# Patient Record
Sex: Female | Born: 1970 | Race: Black or African American | Hispanic: No | Marital: Married | Smoking: Never smoker
Health system: Southern US, Community
[De-identification: ages and names within clinical notes are randomized; demographics above are authoritative.]

## PROBLEM LIST (undated history)

## (undated) DIAGNOSIS — U071 COVID-19: Secondary | ICD-10-CM

## (undated) DIAGNOSIS — R7303 Prediabetes: Secondary | ICD-10-CM

## (undated) DIAGNOSIS — K8020 Calculus of gallbladder without cholecystitis without obstruction: Secondary | ICD-10-CM

## (undated) DIAGNOSIS — Z9889 Other specified postprocedural states: Secondary | ICD-10-CM

## (undated) DIAGNOSIS — I10 Essential (primary) hypertension: Secondary | ICD-10-CM

## (undated) HISTORY — DX: Essential (primary) hypertension: I10

---

## 1997-08-16 HISTORY — PX: TUBAL LIGATION: SHX77

## 2012-04-19 ENCOUNTER — Other Ambulatory Visit (HOSPITAL_COMMUNITY): Payer: Self-pay | Admitting: Nurse Practitioner

## 2012-04-19 DIAGNOSIS — Z139 Encounter for screening, unspecified: Secondary | ICD-10-CM

## 2012-04-21 ENCOUNTER — Ambulatory Visit (HOSPITAL_COMMUNITY)
Admission: RE | Admit: 2012-04-21 | Discharge: 2012-04-21 | Disposition: A | Discharge status: Home / Self Care | Payer: Self-pay | Source: Ambulatory Visit | Attending: Nurse Practitioner | Admitting: Nurse Practitioner

## 2012-04-21 DIAGNOSIS — Z139 Encounter for screening, unspecified: Secondary | ICD-10-CM

## 2012-04-25 ENCOUNTER — Ambulatory Visit (HOSPITAL_COMMUNITY): Payer: Self-pay

## 2013-03-15 ENCOUNTER — Encounter (HOSPITAL_COMMUNITY): Payer: Self-pay | Admitting: Emergency Medicine

## 2013-03-15 ENCOUNTER — Emergency Department (HOSPITAL_COMMUNITY): Payer: Medicaid Other

## 2013-03-15 ENCOUNTER — Emergency Department (HOSPITAL_COMMUNITY)
Admission: EM | Admit: 2013-03-15 | Discharge: 2013-03-15 | Disposition: A | Discharge status: Home / Self Care | Payer: Medicaid Other | Attending: Emergency Medicine | Admitting: Emergency Medicine

## 2013-03-15 DIAGNOSIS — R1013 Epigastric pain: Secondary | ICD-10-CM | POA: Insufficient documentation

## 2013-03-15 DIAGNOSIS — Z79899 Other long term (current) drug therapy: Secondary | ICD-10-CM | POA: Insufficient documentation

## 2013-03-15 DIAGNOSIS — R109 Unspecified abdominal pain: Secondary | ICD-10-CM

## 2013-03-15 DIAGNOSIS — Z3202 Encounter for pregnancy test, result negative: Secondary | ICD-10-CM | POA: Insufficient documentation

## 2013-03-15 LAB — CBC WITH DIFFERENTIAL/PLATELET
Basophils Absolute: 0 10*3/uL (ref 0.0–0.1)
Basophils Relative: 0 % (ref 0–1)
Eosinophils Absolute: 0.2 10*3/uL (ref 0.0–0.7)
Eosinophils Relative: 2 % (ref 0–5)
HCT: 38 % (ref 36.0–46.0)
Hemoglobin: 13.2 g/dL (ref 12.0–15.0)
Lymphocytes Relative: 40 % (ref 12–46)
Lymphs Abs: 3.6 10*3/uL (ref 0.7–4.0)
MCH: 29.1 pg (ref 26.0–34.0)
MCHC: 34.7 g/dL (ref 30.0–36.0)
MCV: 83.9 fL (ref 78.0–100.0)
Monocytes Absolute: 0.7 10*3/uL (ref 0.1–1.0)
Monocytes Relative: 8 % (ref 3–12)
Neutro Abs: 4.4 10*3/uL (ref 1.7–7.7)
Neutrophils Relative %: 50 % (ref 43–77)
Platelets: 364 10*3/uL (ref 150–400)
RBC: 4.53 MIL/uL (ref 3.87–5.11)
RDW: 12.6 % (ref 11.5–15.5)
WBC: 8.8 10*3/uL (ref 4.0–10.5)

## 2013-03-15 LAB — COMPREHENSIVE METABOLIC PANEL
ALT: 47 U/L — ABNORMAL HIGH (ref 0–35)
AST: 72 U/L — ABNORMAL HIGH (ref 0–37)
Albumin: 3.9 g/dL (ref 3.5–5.2)
Alkaline Phosphatase: 60 U/L (ref 39–117)
BUN: 16 mg/dL (ref 6–23)
CO2: 27 mEq/L (ref 19–32)
Calcium: 10.1 mg/dL (ref 8.4–10.5)
Chloride: 100 mEq/L (ref 96–112)
Creatinine, Ser: 0.75 mg/dL (ref 0.50–1.10)
GFR calc Af Amer: >90 mL/min (ref >90)
GFR calc non Af Amer: >90 mL/min (ref >90)
Glucose, Bld: 119 mg/dL — ABNORMAL HIGH (ref 70–99)
Potassium: 3.5 mEq/L (ref 3.5–5.1)
Sodium: 138 mEq/L (ref 135–145)
Total Bilirubin: 0.3 mg/dL (ref 0.3–1.2)
Total Protein: 7.7 g/dL (ref 6.0–8.3)

## 2013-03-15 LAB — URINALYSIS, ROUTINE W REFLEX MICROSCOPIC
Bilirubin Urine: NEGATIVE
Glucose, UA: NEGATIVE mg/dL
Ketones, ur: NEGATIVE mg/dL
Leukocytes, UA: NEGATIVE
Nitrite: NEGATIVE
Protein, ur: 30 mg/dL — AB
Specific Gravity, Urine: 1.025 (ref 1.005–1.030)
Urobilinogen, UA: 1 mg/dL (ref 0.0–1.0)
pH: 7 (ref 5.0–8.0)

## 2013-03-15 LAB — PREGNANCY, URINE: Preg Test, Ur: NEGATIVE

## 2013-03-15 LAB — URINE MICROSCOPIC-ADD ON

## 2013-03-15 LAB — TROPONIN I: Troponin I: <0.3 ng/mL (ref <0.30)

## 2013-03-15 LAB — LIPASE, BLOOD: Lipase: 34 U/L (ref 11–59)

## 2013-03-15 MED ORDER — OXYCODONE-ACETAMINOPHEN 5-325 MG PO TABS: 1.0000 | ORAL_TABLET | ORAL | Status: DC | PRN

## 2013-03-15 MED ORDER — ONDANSETRON HCL 4 MG/2ML IJ SOLN
4.0000 mg | Freq: Once | INTRAMUSCULAR | Status: AC
  Administered 2013-03-15: 4 mg via INTRAVENOUS
  Filled 2013-03-15: qty 2

## 2013-03-15 MED ORDER — PROMETHAZINE HCL 25 MG PO TABS: 25.0000 mg | ORAL_TABLET | Freq: Four times a day (QID) | ORAL | Status: DC | PRN

## 2013-03-15 MED ORDER — MORPHINE SULFATE 4 MG/ML IJ SOLN
4.0000 mg | Freq: Once | INTRAMUSCULAR | Status: AC
  Administered 2013-03-15: 4 mg via INTRAVENOUS
  Filled 2013-03-15: qty 1

## 2013-03-15 MED ORDER — SODIUM CHLORIDE 0.9 % IV BOLUS (SEPSIS)
1000.0000 mL | Freq: Once | INTRAVENOUS | Status: AC
  Administered 2013-03-15: 1000 mL via INTRAVENOUS

## 2013-03-15 MED ORDER — IOHEXOL 300 MG/ML  SOLN
50.0000 mL | Freq: Once | INTRAMUSCULAR | Status: AC | PRN
  Administered 2013-03-15: 50 mL via INTRAVENOUS

## 2013-03-15 MED ORDER — IOHEXOL 300 MG/ML  SOLN
100.0000 mL | Freq: Once | INTRAMUSCULAR | Status: AC | PRN
  Administered 2013-03-15: 100 mL via INTRAVENOUS

## 2013-03-15 NOTE — ED Provider Notes (Signed)
CSN: [id number]     Arrival date & time 03/15/13  1848 History   First MD Initiated Contact with Patient 03/15/13 1903     Chief Complaint  Patient presents with  . Abdominal Pain  . Shortness of Breath   (Consider location/radiation/quality/duration/timing/severity/associated sxs/prior Treatment) HPI.... abrupt onset sharp epigastric pain approximately 30 minutes prior to visit.   No nausea, vomiting, diarrhea, fever, previous similar symptoms.  Patient is obese but claims to have no other medical problems.  No radiation of pain. Nothing makes symptoms better or worse. Severity is moderate.  History reviewed. No pertinent past medical history. Past Surgical History  Procedure Laterality Date  . Tubal ligation     No family history on file. History  Substance Use Topics  . Smoking status: Never Smoker   . Smokeless tobacco: Not on file  . Alcohol Use: No   OB History   Grav Para Term Preterm Abortions TAB SAB Ect Mult Living                 Review of Systems  All other systems reviewed and are negative.    Allergies  Review of patient's allergies indicates no known allergies.  Home Medications   Current Outpatient Rx  Name  Route  Sig  Dispense  Refill  . omeprazole (PRILOSEC OTC) 20 MG tablet   Oral   Take 20 mg by mouth once as needed.         Marland Kitchen oxyCODONE-acetaminophen (PERCOCET) 5-325 MG per tablet   Oral   Take 1 tablet by mouth every 4 (four) hours as needed for pain.   20 tablet   0   . promethazine (PHENERGAN) 25 MG tablet   Oral   Take 1 tablet (25 mg total) by mouth every 6 (six) hours as needed for nausea.   20 tablet   0    BP 152/111  Pulse 110  Temp(Src) 97.6 F (36.4 C) (Oral)  Resp 22  Ht 5' (1.524 m)  Wt 220 lb (99.791 kg)  BMI 42.97 kg/m2  SpO2 99%  LMP 03/13/2013 Physical Exam  Nursing note and vitals reviewed. Constitutional: She is oriented to person, place, and time. She appears well-developed and well-nourished.  Obese,  moderate distress  HENT:  Head: Normocephalic and atraumatic.  Eyes: Conjunctivae and EOM are normal. Pupils are equal, round, and reactive to light.  Neck: Normal range of motion. Neck supple.  Cardiovascular: Normal rate, regular rhythm and normal heart sounds.   Pulmonary/Chest: Effort normal and breath sounds normal.  Abdominal: Soft. Bowel sounds are normal.  Minimal epigastric tenderness  Musculoskeletal: Normal range of motion.  Neurological: She is alert and oriented to person, place, and time.  Skin: Skin is warm and dry.  Psychiatric: She has a normal mood and affect.    ED Course  Procedures (including critical care time) Labs Review Labs Reviewed  COMPREHENSIVE METABOLIC PANEL - Abnormal; Notable for the following:    Glucose, Bld 119 (*)    AST 72 (*)    ALT 47 (*)    All other components within normal limits  URINALYSIS, ROUTINE W REFLEX MICROSCOPIC - Abnormal; Notable for the following:    Hgb urine dipstick TRACE (*)    Protein, ur 30 (*)    All other components within normal limits  URINE MICROSCOPIC-ADD ON - Abnormal; Notable for the following:    Squamous Epithelial / LPF FEW (*)    All other components within normal limits  CBC WITH  DIFFERENTIAL  LIPASE, BLOOD  PREGNANCY, URINE  TROPONIN I   Imaging Review Ct Abdomen Pelvis W Contrast  03/15/2013   CLINICAL DATA:  Right upper quadrant abdominal pain.  EXAM: CT ABDOMEN AND PELVIS WITH CONTRAST  TECHNIQUE: Multidetector CT imaging of the abdomen and pelvis was performed using the standard protocol following bolus administration of intravenous contrast.  CONTRAST:  OMNIPAQUE IOHEXOL 300 MG/ML  SOLN  COMPARISON:  None.  FINDINGS: Visualized lung bases appear normal. The liver, spleen and pancreas appear normal. No gallstones are noted. Adrenal glands appear normal. No hydronephrosis or renal obstruction is noted. No renal or ureteral calculi are noted. The appendix appears normal. No evidence of bowel  obstruction is noted. Urinary bladder appears normal. 5 cm fibroid arises from the uterine fundus. No abnormal fluid collection is noted.  IMPRESSION: 5 cm fibroid arising from the uterine fundus. No other abnormality seen in the abdomen or pelvis.   Electronically Signed   By: Roque Lias M.D.   On: 03/15/2013 20:46    EKG Interpretation     Ventricular Rate:  109 PR Interval:  154 QRS Duration: 76 QT Interval:  302 QTC Calculation: 406 R Axis:   62 Text Interpretation:  Sinus tachycardia Nonspecific T wave abnormality Abnormal ECG No previous ECGs available            MDM   1. Abdominal pain    CT scan reveals a 5 cm fibroid.   Additionally, AST and ALT are minimally elevated.   Will order an ultrasound of the gallbladder tomorrow morning.   Discharge medications Percocet and Phenergan 25 mg    Donnetta Hutching, MD 03/15/13 2155

## 2013-03-15 NOTE — ED Notes (Signed)
Pt c/o upper abd pain, nausea, and SOB x 30 min.  LBM was this am and was normal.

## 2013-03-16 ENCOUNTER — Ambulatory Visit (HOSPITAL_COMMUNITY)
Admit: 2013-03-16 | Discharge: 2013-03-16 | Disposition: A | Discharge status: Home / Self Care | Payer: Medicaid Other | Attending: Emergency Medicine | Admitting: Emergency Medicine

## 2013-03-16 DIAGNOSIS — K8020 Calculus of gallbladder without cholecystitis without obstruction: Secondary | ICD-10-CM | POA: Insufficient documentation

## 2013-03-16 DIAGNOSIS — R1013 Epigastric pain: Secondary | ICD-10-CM

## 2013-07-20 ENCOUNTER — Encounter (HOSPITAL_COMMUNITY): Payer: Self-pay | Admitting: Emergency Medicine

## 2013-07-20 ENCOUNTER — Emergency Department (HOSPITAL_COMMUNITY)
Admission: EM | Admit: 2013-07-20 | Discharge: 2013-07-20 | Disposition: A | Discharge status: Home / Self Care | Payer: Medicaid Other | Attending: Emergency Medicine | Admitting: Emergency Medicine

## 2013-07-20 DIAGNOSIS — R7989 Other specified abnormal findings of blood chemistry: Secondary | ICD-10-CM | POA: Insufficient documentation

## 2013-07-20 DIAGNOSIS — K8020 Calculus of gallbladder without cholecystitis without obstruction: Secondary | ICD-10-CM | POA: Insufficient documentation

## 2013-07-20 DIAGNOSIS — R945 Abnormal results of liver function studies: Secondary | ICD-10-CM

## 2013-07-20 DIAGNOSIS — K8050 Calculus of bile duct without cholangitis or cholecystitis without obstruction: Secondary | ICD-10-CM

## 2013-07-20 DIAGNOSIS — Z3202 Encounter for pregnancy test, result negative: Secondary | ICD-10-CM | POA: Insufficient documentation

## 2013-07-20 HISTORY — DX: Calculus of gallbladder without cholecystitis without obstruction: K80.20

## 2013-07-20 LAB — CBC WITH DIFFERENTIAL/PLATELET
Basophils Absolute: 0 10*3/uL (ref 0.0–0.1)
Basophils Relative: 0 % (ref 0–1)
Eosinophils Absolute: 0.1 10*3/uL (ref 0.0–0.7)
Eosinophils Relative: 2 % (ref 0–5)
HCT: 38.7 % (ref 36.0–46.0)
Hemoglobin: 13.4 g/dL (ref 12.0–15.0)
Lymphocytes Relative: 36 % (ref 12–46)
Lymphs Abs: 2.1 10*3/uL (ref 0.7–4.0)
MCH: 29 pg (ref 26.0–34.0)
MCHC: 34.6 g/dL (ref 30.0–36.0)
MCV: 83.8 fL (ref 78.0–100.0)
Monocytes Absolute: 0.6 10*3/uL (ref 0.1–1.0)
Monocytes Relative: 11 % (ref 3–12)
Neutro Abs: 3 10*3/uL (ref 1.7–7.7)
Neutrophils Relative %: 51 % (ref 43–77)
Platelets: 373 10*3/uL (ref 150–400)
RBC: 4.62 MIL/uL (ref 3.87–5.11)
RDW: 12.8 % (ref 11.5–15.5)
WBC: 5.8 10*3/uL (ref 4.0–10.5)

## 2013-07-20 LAB — URINALYSIS, ROUTINE W REFLEX MICROSCOPIC
Glucose, UA: NEGATIVE mg/dL
Hgb urine dipstick: NEGATIVE
Leukocytes, UA: NEGATIVE
Nitrite: NEGATIVE
Protein, ur: NEGATIVE mg/dL
Specific Gravity, Urine: 1.025 (ref 1.005–1.030)
Urobilinogen, UA: >8 mg/dL — ABNORMAL HIGH (ref 0.0–1.0)
pH: 7 (ref 5.0–8.0)

## 2013-07-20 LAB — COMPREHENSIVE METABOLIC PANEL
ALT: 163 U/L — ABNORMAL HIGH (ref 0–35)
AST: 229 U/L — ABNORMAL HIGH (ref 0–37)
Albumin: 4.2 g/dL (ref 3.5–5.2)
Alkaline Phosphatase: 107 U/L (ref 39–117)
BUN: 12 mg/dL (ref 6–23)
CO2: 26 mEq/L (ref 19–32)
Calcium: 9.7 mg/dL (ref 8.4–10.5)
Chloride: 103 mEq/L (ref 96–112)
Creatinine, Ser: 0.74 mg/dL (ref 0.50–1.10)
GFR calc Af Amer: >90 mL/min (ref >90)
GFR calc non Af Amer: >90 mL/min (ref >90)
Glucose, Bld: 137 mg/dL — ABNORMAL HIGH (ref 70–99)
Potassium: 3.8 mEq/L (ref 3.7–5.3)
Sodium: 140 mEq/L (ref 137–147)
Total Bilirubin: 1 mg/dL (ref 0.3–1.2)
Total Protein: 7.9 g/dL (ref 6.0–8.3)

## 2013-07-20 LAB — LIPASE, BLOOD: Lipase: 53 U/L (ref 11–59)

## 2013-07-20 LAB — PREGNANCY, URINE: Preg Test, Ur: NEGATIVE

## 2013-07-20 MED ORDER — ONDANSETRON HCL 4 MG/2ML IJ SOLN
4.0000 mg | Freq: Once | INTRAMUSCULAR | Status: AC
  Administered 2013-07-20: 4 mg via INTRAVENOUS
  Filled 2013-07-20: qty 2

## 2013-07-20 MED ORDER — OXYCODONE-ACETAMINOPHEN 5-325 MG PO TABS: 2.0000 | ORAL_TABLET | ORAL | Status: DC | PRN

## 2013-07-20 MED ORDER — SODIUM CHLORIDE 0.9 % IV BOLUS (SEPSIS)
1000.0000 mL | Freq: Once | INTRAVENOUS | Status: AC
  Administered 2013-07-20: 1000 mL via INTRAVENOUS

## 2013-07-20 MED ORDER — MORPHINE SULFATE 4 MG/ML IJ SOLN
4.0000 mg | Freq: Once | INTRAMUSCULAR | Status: AC
  Administered 2013-07-20: 4 mg via INTRAVENOUS
  Filled 2013-07-20: qty 1

## 2013-07-20 NOTE — ED Provider Notes (Signed)
CSN: [id number]     Arrival date & time 07/20/13  0042 History   First MD Initiated Contact with Patient 07/20/13 0112     Chief Complaint  Patient presents with  . Abdominal Pain     (Consider location/radiation/quality/duration/timing/severity/associated sxs/prior Treatment) HPI Comments: Patient is a 43 year old female with known cholelithiasis. She presents today with complaints of epigastric discomfort that started approximately 10 PM. She is felt nauseated and reports that she has vomited. She denies fevers or chills. She denies any urinary complaints. This has been occurring intermittently since October. She had an ultrasound done which revealed gallstones, however she has not had general surgery referral.  Patient is a 43 y.o. female presenting with abdominal pain. The history is provided by the patient.  Abdominal Pain Pain location:  Epigastric Pain quality: cramping   Pain radiates to:  Does not radiate Pain severity:  Severe Onset quality:  Sudden Duration:  3 hours Timing:  Constant Progression:  Worsening Chronicity:  Recurrent   Past Medical History  Diagnosis Date  . Gall stones    Past Surgical History  Procedure Laterality Date  . Tubal ligation     No family history on file. History  Substance Use Topics  . Smoking status: Never Smoker   . Smokeless tobacco: Not on file  . Alcohol Use: No   OB History   Grav Para Term Preterm Abortions TAB SAB Ect Mult Living                 Review of Systems  Gastrointestinal: Positive for abdominal pain.  All other systems reviewed and are negative.      Allergies  Review of patient's allergies indicates no known allergies.  Home Medications   Current Outpatient Rx  Name  Route  Sig  Dispense  Refill  . Multiple Vitamin (MULTIVITAMIN) capsule   Oral   Take 1 capsule by mouth daily.         Marland Kitchen omeprazole (PRILOSEC OTC) 20 MG tablet   Oral   Take 20 mg by mouth once as needed.         Marland Kitchen  oxyCODONE-acetaminophen (PERCOCET) 5-325 MG per tablet   Oral   Take 1 tablet by mouth every 4 (four) hours as needed for pain.   20 tablet   0   . promethazine (PHENERGAN) 25 MG tablet   Oral   Take 1 tablet (25 mg total) by mouth every 6 (six) hours as needed for nausea.   20 tablet   0    BP 155/100  Pulse 110  Temp(Src) 97.9 F (36.6 C) (Oral)  Resp 20  Ht 5' (1.524 m)  Wt 220 lb (99.791 kg)  BMI 42.97 kg/m2  SpO2 100%  LMP 07/16/2013 Physical Exam  Nursing note and vitals reviewed. Constitutional: She is oriented to person, place, and time. She appears well-developed and well-nourished. No distress.  HENT:  Head: Normocephalic and atraumatic.  Neck: Normal range of motion. Neck supple.  Cardiovascular: Normal rate and regular rhythm.  Exam reveals no gallop and no friction rub.   No murmur heard. Pulmonary/Chest: Effort normal and breath sounds normal. No respiratory distress. She has no wheezes.  Abdominal: Soft. Bowel sounds are normal. She exhibits mass. She exhibits no distension. There is tenderness. There is no rebound and no guarding.  Musculoskeletal: Normal range of motion.  Neurological: She is alert and oriented to person, place, and time.  Skin: Skin is warm and dry. She is not diaphoretic.  ED Course  Procedures (including critical care time) Labs Review Labs Reviewed  CBC WITH DIFFERENTIAL  COMPREHENSIVE METABOLIC PANEL  LIPASE, BLOOD  URINALYSIS, ROUTINE W REFLEX MICROSCOPIC  PREGNANCY, URINE   Imaging Review No results found.   EKG Interpretation None      MDM   Final diagnoses:  None    Patient is a 43 year old female with known gallstones. These were found on ultrasound in October. She's been having intermittent discomfort since that time. Became significantly worse this evening and she presents for evaluation. Laboratory studies reveal no elevation of white count, however there are mild elevations of her LFTs. Her lipase is  normal. She is feeling better with fluids and pain medication in the ER. I discussed the case with Dr. Arnoldo Morale from general surgery who recommends pain medication and followup in the office. She is to call him to arrange this appointment. She will be discharged and understands to return if she develops high fever, worsening pain, or other new and concerning symptoms.    Veryl Speak, MD 07/20/13 [phone]

## 2013-07-20 NOTE — ED Notes (Signed)
Pt reports upper abdominal pain that started around 2200 yesterday. Pt reports nausea & vomiting.

## 2013-07-20 NOTE — Discharge Instructions (Signed)
Percocet as needed for pain.  Followup with Dr. Arnoldo Morale in the next several days. His contact information has been provided to you in this discharge summary. You're to call the office to arrange this appointment.  Return to the ER if you develop worsening pain, high fever, bloody stool, or other new and concerning symptoms.   Biliary Colic  Biliary colic is a steady or irregular pain in the upper abdomen. It is usually under the right side of the rib cage. It happens when gallstones interfere with the normal flow of bile from the gallbladder. Bile is a liquid that helps to digest fats. Bile is made in the liver and stored in the gallbladder. When you eat a meal, bile passes from the gallbladder through the cystic duct and the common bile duct into the small intestine. There, it mixes with partially digested food. If a gallstone blocks either of these ducts, the normal flow of bile is blocked. The muscle cells in the bile duct contract forcefully to try to move the stone. This causes the pain of biliary colic.  SYMPTOMS   A person with biliary colic usually complains of pain in the upper abdomen. This pain can be:  In the center of the upper abdomen just below the breastbone.  In the upper-right part of the abdomen, near the gallbladder and liver.  Spread back toward the right shoulder blade.  Nausea and vomiting.  The pain usually occurs after eating.  Biliary colic is usually triggered by the digestive system's demand for bile. The demand for bile is high after fatty meals. Symptoms can also occur when a person who has been fasting suddenly eats a very large meal. Most episodes of biliary colic pass after 1 to 5 hours. After the most intense pain passes, your abdomen may continue to ache mildly for about 24 hours. DIAGNOSIS  After you describe your symptoms, your caregiver will perform a physical exam. He or she will pay attention to the upper right portion of your belly (abdomen). This is  the area of your liver and gallbladder. An ultrasound will help your caregiver look for gallstones. Specialized scans of the gallbladder may also be done. Blood tests may be done, especially if you have fever or if your pain persists. PREVENTION  Biliary colic can be prevented by controlling the risk factors for gallstones. Some of these risk factors, such as heredity, increasing age, and pregnancy are a normal part of life. Obesity and a high-fat diet are risk factors you can change through a healthy lifestyle. Women going through menopause who take hormone replacement therapy (estrogen) are also more likely to develop biliary colic. TREATMENT   Pain medication may be prescribed.  You may be encouraged to eat a fat-free diet.  If the first episode of biliary colic is severe, or episodes of colic keep retuning, surgery to remove the gallbladder (cholecystectomy) is usually recommended. This procedure can be done through small incisions using an instrument called a laparoscope. The procedure often requires a brief stay in the hospital. Some people can leave the hospital the same day. It is the most widely used treatment in people troubled by painful gallstones. It is effective and safe, with no complications in more than 90% of cases.  If surgery cannot be done, medication that dissolves gallstones may be used. This medication is expensive and can take months or years to work. Only small stones will dissolve.  Rarely, medication to dissolve gallstones is combined with a procedure called shock-wave  lithotripsy. This procedure uses carefully aimed shock waves to break up gallstones. In many people treated with this procedure, gallstones form again within a few years. PROGNOSIS  If gallstones block your cystic duct or common bile duct, you are at risk for repeated episodes of biliary colic. There is also a 25% chance that you will develop a gallbladder infection(acute cholecystitis), or some other  complication of gallstones within 10 to 20 years. If you have surgery, schedule it at a time that is convenient for you and at a time when you are not sick. HOME CARE INSTRUCTIONS   Drink plenty of clear fluids.  Avoid fatty, greasy or fried foods, or any foods that make your pain worse.  Take medications as directed. SEEK MEDICAL CARE IF:   You develop a fever over 100.5 F (38.1 C).  Your pain gets worse over time.  You develop nausea that prevents you from eating and drinking.  You develop vomiting. SEEK IMMEDIATE MEDICAL CARE IF:   You have continuous or severe belly (abdominal) pain which is not relieved with medications.  You develop nausea and vomiting which is not relieved with medications.  You have symptoms of biliary colic and you suddenly develop a fever and shaking chills. This may signal cholecystitis. Call your caregiver immediately.  You develop a yellow color to your skin or the white part of your eyes (jaundice). Document Released: 10/05/2005 Document Revised: 07/27/2011 Document Reviewed: 12/15/2007 [facility] Patient Information 2014 [place].

## 2013-08-10 ENCOUNTER — Encounter (HOSPITAL_COMMUNITY): Payer: Self-pay | Admitting: Pharmacy Technician

## 2013-08-11 NOTE — H&P (Signed)
  NTS SOAP Note  Vital Signs:  Vitals as of: 08/29/2438: Systolic 102: Diastolic 725: Heart Rate 82: Temp 98.36F: Height 26ft 0in: Weight 204Lbs 0 Ounces: BMI 39.84  BMI : 39.84 kg/m2  Subjective: This 43 Years 29 Months old Female presents for of biliary colic.  She has been having right upper quadrant abdominal pain, nausea, and fatty food intolerance over the past few months.  No fever, chills, jaundice.  Review of Symptoms:  Constitutional:unremarkable   Head:unremarkable    Eyes:unremarkable   Nose/Mouth/Throat:unremarkable Cardiovascular:  unremarkable   Respiratory:unremarkable   Gastrointestinal:  unremarkable   Genitourinary:unremarkable     Musculoskeletal:unremarkable   Skin:unremarkable Hematolgic/Lymphatic:unremarkable     Allergic/Immunologic:unremarkable     Past Medical History:    Reviewed  Past Medical History  Surgical History: BTL Medical Problems: none Allergies: nkda Medications: compazine, oxycodone   Social History:Reviewed  Social History  Preferred Language: English Race:  Black or African American Ethnicity: Not Hispanic / Latino Age: 43 Years 4 Months Marital Status:  M Alcohol: no Recreational drug(s): no   Smoking Status: Never smoker reviewed on 08/10/2013 Functional Status reviewed on 08/10/2013 ------------------------------------------------ Bathing: Normal Cooking: Normal Dressing: Normal Driving: Normal Eating: Normal Managing Meds: Normal Oral Care: Normal Shopping: Normal Toileting: Normal Transferring: Normal Walking: Normal Cognitive Status reviewed on 08/10/2013 ------------------------------------------------ Attention: Normal Decision Making: Normal Language: Normal Memory: Normal Motor: Normal Perception: Normal Problem Solving: Normal Visual and Spatial: Normal   Family History:  Reviewed  Family Health History Mother, Deceased; Pulmonary embolism;   Father, Living; Colon cancer;     Objective Information: General:  Well appearing, well nourished in no distress.   no scleral icterus Heart:  RRR, no murmur or gallop.  Normal S1, S2.  No S3, S4.  Lungs:    CTA bilaterally, no wheezes, rhonchi, rales.  Breathing unlabored. Abdomen:Soft, NT/ND, normal bowel sounds, no HSM, no masses.  No peritoneal signs.  Assessment:Biliary colic, cholelithiasis  Diagnoses: 574.20 Gallstone (Calculus of gallbladder without cholecystitis without obstruction)  Procedures: 36644 - OFFICE OUTPATIENT NEW 30 MINUTES    Plan:  Scheduled for laparoscopic cholecystectomy on 08/23/13.   Patient Education:Alternative treatments to surgery were discussed with patient (and family).  Risks and benefits  of procedure including bleeding, infection, hepatobiliary injury, and the possibility of an open procedure were fully explained to the patient (and family) who gave informed consent. Patient/family questions were addressed.  Follow-up:Pending Surgery

## 2013-08-16 NOTE — Patient Instructions (Addendum)
Christina Arroyo  08/16/2013   Your procedure is scheduled on:  08/23/2013  Report to [facility] at 11:00 AM.  Call this number if you have problems the morning of surgery: [phone]   Remember:   Do not eat food or drink liquids after midnight.   Take these medicines the morning of surgery with A SIP OF WATER: PERCOCET & PHENERGAN   Do not wear jewelry, make-up or nail polish.  Do not wear lotions, powders, or perfumes.   Do not shave 48 hours prior to surgery. Men may shave face and neck.  Do not bring valuables to the hospital.  [facility] is not responsible for any belongings or valuables.               Contacts, dentures or bridgework may not be worn into surgery.  Leave suitcase in the car. After surgery it may be brought to your room.  For patients admitted to the hospital, discharge time is determined by your treatment team.               Patients discharged the day of surgery will not be allowed to drive home.  Name and phone number of your driver: Family  Special Instructions: Incentive Spirometry - Practice and bring it with you on the day of surgery. Shower using CHG 2 nights before surgery and the night before surgery.  If you shower the day of surgery use CHG.  Use special wash - you have one bottle of CHG for all showers.  You should use approximately 1/3 of the bottle for each shower.   Please read over the following fact sheets that you were given: Pain Booklet, Coughing and Deep Breathing, Surgical Site Infection Prevention, Anesthesia Post-op Instructions and Care and Recovery After Surgery PATIENT INSTRUCTIONS POST-ANESTHESIA  IMMEDIATELY FOLLOWING SURGERY:  Do not drive or operate machinery for the first twenty four hours after surgery.  Do not make any important decisions for twenty four hours after surgery or while taking narcotic pain medications or sedatives.  If you develop intractable nausea and vomiting or a severe headache please notify your doctor  immediately.  FOLLOW-UP:  Please make an appointment with your surgeon as instructed. You do not need to follow up with anesthesia unless specifically instructed to do so.  WOUND CARE INSTRUCTIONS (if applicable):  Keep a dry clean dressing on the anesthesia/puncture wound site if there is drainage.  Once the wound has quit draining you may leave it open to air.  Generally you should leave the bandage intact for twenty four hours unless there is drainage.  If the epidural site drains for more than 36-48 hours please call the anesthesia department.  QUESTIONS?:  Please feel free to call your physician or the hospital operator if you have any questions, and they will be happy to assist you.      Laparoscopic Cholecystectomy Laparoscopic cholecystectomy is surgery to remove the gallbladder. The gallbladder is located in the upper right part of the abdomen, behind the liver. It is a storage sac for bile produced in the liver. Bile aids in the digestion and absorption of fats. Cholecystectomy is often done for inflammation of the gallbladder (cholecystitis). This condition is usually caused by a buildup of gallstones (cholelithiasis) in your gallbladder. Gallstones can block the flow of bile, resulting in inflammation and pain. In severe cases, emergency surgery may be required. When emergency surgery is not required, you will have time to prepare for the procedure. Laparoscopic surgery is  an alternative to open surgery. Laparoscopic surgery has a shorter recovery time. Your common bile duct may also need to be examined during the procedure. If stones are found in the common bile duct, they may be removed. LET [facility] CARE PROVIDER KNOW ABOUT:  Any allergies you have.  All medicines you are taking, including vitamins, herbs, eye drops, creams, and over-the-counter medicines.  Previous problems you or members of your family have had with the use of anesthetics.  Any blood disorders you  have.  Previous surgeries you have had.  Medical conditions you have. RISKS AND COMPLICATIONS Generally, this is a safe procedure. However, as with any procedure, complications can occur. Possible complications include:  Infection.  Damage to the common bile duct, nerves, arteries, veins, or other internal organs such as the stomach, liver, or intestines.  Bleeding.  A stone may remain in the common bile duct.  A bile leak from the cyst duct that is clipped when your gallbladder is removed.  The need to convert to open surgery, which requires a larger incision in the abdomen. This may be necessary if your surgeon thinks it is not safe to continue with a laparoscopic procedure. BEFORE THE PROCEDURE  Ask your health care provider about changing or stopping any regular medicines. You will need to stop taking aspirin or blood thinners at least 5 days prior to surgery.  Do not eat or drink anything after midnight the night before surgery.  Let your health care provider know if you develop a cold or other infectious problem before surgery. PROCEDURE   You will be given medicine to make you sleep through the procedure (general anesthetic). A breathing tube will be placed in your mouth.  When you are asleep, your surgeon will make several small cuts (incisions) in your abdomen.  A thin, lighted tube with a tiny camera on the end (laparoscope) is inserted through one of the small incisions. The camera on the laparoscope sends a picture to a TV screen in the operating room. This gives the surgeon a good view inside your abdomen.  A gas will be pumped into your abdomen. This expands your abdomen so that the surgeon has more room to perform the surgery.  Other tools needed for the procedure are inserted through the other incisions. The gallbladder is removed through one of the incisions.  After the removal of your gallbladder, the incisions will be closed with stitches, staples, or skin  glue. AFTER THE PROCEDURE  You will be taken to a recovery area where your progress will be checked often.  You may be allowed to go home the same day if your pain is controlled and you can tolerate liquids. Document Released: 05/04/2005 Document Revised: 02/22/2013 Document Reviewed: 12/14/2012 [facility] Patient Information 2014 [place].

## 2013-08-17 ENCOUNTER — Encounter (HOSPITAL_COMMUNITY): Payer: Self-pay

## 2013-08-17 ENCOUNTER — Encounter (HOSPITAL_COMMUNITY)
Admission: RE | Admit: 2013-08-17 | Discharge: 2013-08-17 | Disposition: A | Discharge status: Home / Self Care | Payer: Medicaid Other | Source: Ambulatory Visit | Attending: General Surgery | Admitting: General Surgery

## 2013-08-17 DIAGNOSIS — Z01812 Encounter for preprocedural laboratory examination: Secondary | ICD-10-CM | POA: Diagnosis present

## 2013-08-17 LAB — BASIC METABOLIC PANEL
BUN: 7 mg/dL (ref 6–23)
CO2: 28 mEq/L (ref 19–32)
Calcium: 9.6 mg/dL (ref 8.4–10.5)
Chloride: 103 mEq/L (ref 96–112)
Creatinine, Ser: 0.78 mg/dL (ref 0.50–1.10)
GFR calc Af Amer: >90 mL/min (ref >90)
GFR calc non Af Amer: >90 mL/min (ref >90)
Glucose, Bld: 118 mg/dL — ABNORMAL HIGH (ref 70–99)
Potassium: 4.3 mEq/L (ref 3.7–5.3)
Sodium: 140 mEq/L (ref 137–147)

## 2013-08-17 LAB — CBC WITH DIFFERENTIAL/PLATELET
Basophils Absolute: 0 10*3/uL (ref 0.0–0.1)
Basophils Relative: 0 % (ref 0–1)
Eosinophils Absolute: 0.1 10*3/uL (ref 0.0–0.7)
Eosinophils Relative: 3 % (ref 0–5)
HCT: 36.3 % (ref 36.0–46.0)
Hemoglobin: 12.3 g/dL (ref 12.0–15.0)
Lymphocytes Relative: 34 % (ref 12–46)
Lymphs Abs: 1.6 10*3/uL (ref 0.7–4.0)
MCH: 28.7 pg (ref 26.0–34.0)
MCHC: 33.9 g/dL (ref 30.0–36.0)
MCV: 84.6 fL (ref 78.0–100.0)
Monocytes Absolute: 0.4 10*3/uL (ref 0.1–1.0)
Monocytes Relative: 8 % (ref 3–12)
Neutro Abs: 2.7 10*3/uL (ref 1.7–7.7)
Neutrophils Relative %: 55 % (ref 43–77)
Platelets: 313 10*3/uL (ref 150–400)
RBC: 4.29 MIL/uL (ref 3.87–5.11)
RDW: 13.1 % (ref 11.5–15.5)
WBC: 4.8 10*3/uL (ref 4.0–10.5)

## 2013-08-17 LAB — HEPATIC FUNCTION PANEL
ALT: 23 U/L (ref 0–35)
AST: 15 U/L (ref 0–37)
Albumin: 3.7 g/dL (ref 3.5–5.2)
Alkaline Phosphatase: 50 U/L (ref 39–117)
Bilirubin, Direct: <0.2 mg/dL (ref 0.0–0.3)
Total Bilirubin: 0.3 mg/dL (ref 0.3–1.2)
Total Protein: 6.9 g/dL (ref 6.0–8.3)

## 2013-08-17 NOTE — Pre-Procedure Instructions (Signed)
Patient given information to sign up for my chart at home. 

## 2013-08-23 ENCOUNTER — Encounter (HOSPITAL_COMMUNITY): Payer: Medicaid Other | Admitting: Anesthesiology

## 2013-08-23 ENCOUNTER — Encounter (HOSPITAL_COMMUNITY)
Admission: RE | Disposition: A | Discharge status: Home / Self Care | Payer: Self-pay | Source: Ambulatory Visit | Attending: General Surgery

## 2013-08-23 ENCOUNTER — Encounter (HOSPITAL_COMMUNITY): Payer: Self-pay | Admitting: *Deleted

## 2013-08-23 ENCOUNTER — Ambulatory Visit (HOSPITAL_COMMUNITY)
Admission: RE | Admit: 2013-08-23 | Discharge: 2013-08-23 | Disposition: A | Discharge status: Home / Self Care | Payer: Medicaid Other | Source: Ambulatory Visit | Attending: General Surgery | Admitting: General Surgery

## 2013-08-23 ENCOUNTER — Ambulatory Visit (HOSPITAL_COMMUNITY): Payer: Medicaid Other | Admitting: Anesthesiology

## 2013-08-23 DIAGNOSIS — Z6839 Body mass index (BMI) 39.0-39.9, adult: Secondary | ICD-10-CM | POA: Insufficient documentation

## 2013-08-23 DIAGNOSIS — K8010 Calculus of gallbladder with chronic cholecystitis without obstruction: Secondary | ICD-10-CM | POA: Insufficient documentation

## 2013-08-23 HISTORY — PX: CHOLECYSTECTOMY: SHX55

## 2013-08-23 SURGERY — LAPAROSCOPIC CHOLECYSTECTOMY
Anesthesia: General | Site: Abdomen

## 2013-08-23 MED ORDER — CIPROFLOXACIN IN D5W 400 MG/200ML IV SOLN
400.0000 mg | INTRAVENOUS | Status: AC
Start: 2013-08-23 — End: 2013-08-23
  Administered 2013-08-23: 400 mg via INTRAVENOUS

## 2013-08-23 MED ORDER — ONDANSETRON HCL 4 MG/2ML IJ SOLN
4.0000 mg | Freq: Once | INTRAMUSCULAR | Status: AC
  Administered 2013-08-23: 4 mg via INTRAVENOUS

## 2013-08-23 MED ORDER — MIDAZOLAM HCL 2 MG/2ML IJ SOLN
INTRAMUSCULAR | Status: AC
  Filled 2013-08-23: qty 2

## 2013-08-23 MED ORDER — POVIDONE-IODINE 10 % OINT PACKET
TOPICAL_OINTMENT | CUTANEOUS | Status: DC | PRN
  Administered 2013-08-23: 2 via TOPICAL

## 2013-08-23 MED ORDER — FENTANYL CITRATE 0.05 MG/ML IJ SOLN
INTRAMUSCULAR | Status: AC
  Filled 2013-08-23: qty 5

## 2013-08-23 MED ORDER — CHLORHEXIDINE GLUCONATE 4 % EX LIQD: 1.0000 "application " | Freq: Once | CUTANEOUS | Status: DC

## 2013-08-23 MED ORDER — KETOROLAC TROMETHAMINE 30 MG/ML IJ SOLN
INTRAMUSCULAR | Status: AC
  Filled 2013-08-23: qty 1

## 2013-08-23 MED ORDER — PROPOFOL 10 MG/ML IV BOLUS
INTRAVENOUS | Status: DC | PRN
  Administered 2013-08-23: 150 mg via INTRAVENOUS

## 2013-08-23 MED ORDER — DEXTROSE 5 % IV SOLN
INTRAVENOUS | Status: DC | PRN
  Administered 2013-08-23: 10:00:00 via INTRAVENOUS

## 2013-08-23 MED ORDER — HEMOSTATIC AGENTS (NO CHARGE) OPTIME
TOPICAL | Status: DC | PRN
  Administered 2013-08-23: 1 via TOPICAL

## 2013-08-23 MED ORDER — ONDANSETRON HCL 4 MG/2ML IJ SOLN
INTRAMUSCULAR | Status: AC
  Filled 2013-08-23: qty 2

## 2013-08-23 MED ORDER — BUPIVACAINE HCL (PF) 0.5 % IJ SOLN
INTRAMUSCULAR | Status: DC | PRN
  Administered 2013-08-23: 10 mL

## 2013-08-23 MED ORDER — FENTANYL CITRATE 0.05 MG/ML IJ SOLN
INTRAMUSCULAR | Status: DC | PRN
  Administered 2013-08-23 (×2): 50 ug via INTRAVENOUS
  Administered 2013-08-23: 150 ug via INTRAVENOUS
  Administered 2013-08-23 (×2): 50 ug via INTRAVENOUS

## 2013-08-23 MED ORDER — NEOSTIGMINE METHYLSULFATE 1 MG/ML IJ SOLN
INTRAMUSCULAR | Status: AC
  Filled 2013-08-23: qty 1

## 2013-08-23 MED ORDER — ROCURONIUM BROMIDE 100 MG/10ML IV SOLN
INTRAVENOUS | Status: DC | PRN
  Administered 2013-08-23: 40 mg via INTRAVENOUS

## 2013-08-23 MED ORDER — CIPROFLOXACIN IN D5W 400 MG/200ML IV SOLN
INTRAVENOUS | Status: AC
  Filled 2013-08-23: qty 200

## 2013-08-23 MED ORDER — GLYCOPYRROLATE 0.2 MG/ML IJ SOLN
INTRAMUSCULAR | Status: AC
  Filled 2013-08-23: qty 1

## 2013-08-23 MED ORDER — POVIDONE-IODINE 10 % EX OINT
TOPICAL_OINTMENT | CUTANEOUS | Status: AC
  Filled 2013-08-23: qty 2

## 2013-08-23 MED ORDER — MIDAZOLAM HCL 5 MG/5ML IJ SOLN
INTRAMUSCULAR | Status: DC | PRN
  Administered 2013-08-23: 2 mg via INTRAVENOUS

## 2013-08-23 MED ORDER — GLYCOPYRROLATE 0.2 MG/ML IJ SOLN
INTRAMUSCULAR | Status: AC
  Filled 2013-08-23: qty 2

## 2013-08-23 MED ORDER — LABETALOL HCL 5 MG/ML IV SOLN
INTRAVENOUS | Status: DC | PRN
  Administered 2013-08-23: 5 mg via INTRAVENOUS

## 2013-08-23 MED ORDER — LIDOCAINE HCL 1 % IJ SOLN
INTRAMUSCULAR | Status: DC | PRN
  Administered 2013-08-23: 50 mg via INTRADERMAL

## 2013-08-23 MED ORDER — GLYCOPYRROLATE 0.2 MG/ML IJ SOLN
0.2000 mg | Freq: Once | INTRAMUSCULAR | Status: AC
  Administered 2013-08-23: 0.2 mg via INTRAVENOUS

## 2013-08-23 MED ORDER — NEOSTIGMINE METHYLSULFATE 1 MG/ML IJ SOLN
INTRAMUSCULAR | Status: DC | PRN
  Administered 2013-08-23: 1 mg via INTRAVENOUS
  Administered 2013-08-23: 2 mg via INTRAVENOUS
  Administered 2013-08-23: 0.5 mg via INTRAVENOUS

## 2013-08-23 MED ORDER — ROCURONIUM BROMIDE 50 MG/5ML IV SOLN
INTRAVENOUS | Status: AC
  Filled 2013-08-23: qty 1

## 2013-08-23 MED ORDER — FENTANYL CITRATE 0.05 MG/ML IJ SOLN
INTRAMUSCULAR | Status: AC
  Filled 2013-08-23: qty 2

## 2013-08-23 MED ORDER — OXYCODONE-ACETAMINOPHEN 5-325 MG PO TABS: 1.0000 | ORAL_TABLET | ORAL | Status: DC | PRN

## 2013-08-23 MED ORDER — ONDANSETRON HCL 4 MG/2ML IJ SOLN
4.0000 mg | Freq: Once | INTRAMUSCULAR | Status: AC | PRN
  Administered 2013-08-23: 4 mg via INTRAVENOUS

## 2013-08-23 MED ORDER — SODIUM CHLORIDE 0.9 % IR SOLN
Status: DC | PRN
  Administered 2013-08-23: 1000 mL

## 2013-08-23 MED ORDER — FENTANYL CITRATE 0.05 MG/ML IJ SOLN
25.0000 ug | INTRAMUSCULAR | Status: DC | PRN
  Administered 2013-08-23: 50 ug via INTRAVENOUS

## 2013-08-23 MED ORDER — MIDAZOLAM HCL 2 MG/2ML IJ SOLN
1.0000 mg | INTRAMUSCULAR | Status: DC | PRN
  Administered 2013-08-23 (×2): 2 mg via INTRAVENOUS

## 2013-08-23 MED ORDER — BUPIVACAINE HCL (PF) 0.5 % IJ SOLN
INTRAMUSCULAR | Status: AC
  Filled 2013-08-23: qty 30

## 2013-08-23 MED ORDER — LIDOCAINE HCL (PF) 1 % IJ SOLN
INTRAMUSCULAR | Status: AC
  Filled 2013-08-23: qty 5

## 2013-08-23 MED ORDER — GLYCOPYRROLATE 0.2 MG/ML IJ SOLN
INTRAMUSCULAR | Status: DC | PRN
  Administered 2013-08-23 (×2): 0.2 mg via INTRAVENOUS

## 2013-08-23 MED ORDER — LACTATED RINGERS IV SOLN
INTRAVENOUS | Status: DC
  Administered 2013-08-23 (×2): via INTRAVENOUS

## 2013-08-23 MED ORDER — PROPOFOL 10 MG/ML IV BOLUS
INTRAVENOUS | Status: AC
  Filled 2013-08-23: qty 20

## 2013-08-23 MED ORDER — LABETALOL HCL 5 MG/ML IV SOLN
INTRAVENOUS | Status: AC
  Filled 2013-08-23: qty 4

## 2013-08-23 SURGICAL SUPPLY — 41 items
APPLIER CLIP LAPSCP 10X32 DD (CLIP) ×2 IMPLANT
BAG HAMPER (MISCELLANEOUS) ×2 IMPLANT
CLOTH BEACON ORANGE TIMEOUT ST (SAFETY) ×2 IMPLANT
COVER LIGHT HANDLE STERIS (MISCELLANEOUS) ×4 IMPLANT
DECANTER SPIKE VIAL GLASS SM (MISCELLANEOUS) ×2 IMPLANT
DURAPREP 26ML APPLICATOR (WOUND CARE) ×2 IMPLANT
ELECT REM PT RETURN 9FT ADLT (ELECTROSURGICAL) ×2
ELECTRODE REM PT RTRN 9FT ADLT (ELECTROSURGICAL) ×1 IMPLANT
FILTER SMOKE EVAC LAPAROSHD (FILTER) ×2 IMPLANT
FORMALIN 10 PREFIL 120ML (MISCELLANEOUS) ×2 IMPLANT
GLOVE BIO SURGEON STRL SZ7.5 (GLOVE) ×2 IMPLANT
GLOVE BIOGEL PI IND STRL 7.0 (GLOVE) ×2 IMPLANT
GLOVE BIOGEL PI IND STRL 8 (GLOVE) ×1 IMPLANT
GLOVE BIOGEL PI INDICATOR 7.0 (GLOVE) ×2
GLOVE BIOGEL PI INDICATOR 8 (GLOVE) ×1
GLOVE ECLIPSE 6.5 STRL STRAW (GLOVE) ×4 IMPLANT
GLOVE EXAM NITRILE MD LF STRL (GLOVE) ×2 IMPLANT
GOWN STRL REUS W/TWL LRG LVL3 (GOWN DISPOSABLE) ×6 IMPLANT
HEMOSTAT SNOW SURGICEL 2X4 (HEMOSTASIS) ×2 IMPLANT
INST SET LAPROSCOPIC AP (KITS) ×2 IMPLANT
IV NS IRRIG 3000ML ARTHROMATIC (IV SOLUTION) IMPLANT
KIT ROOM TURNOVER APOR (KITS) ×2 IMPLANT
MANIFOLD NEPTUNE II (INSTRUMENTS) ×2 IMPLANT
NEEDLE INSUFFLATION 14GA 120MM (NEEDLE) ×2 IMPLANT
NS IRRIG 1000ML POUR BTL (IV SOLUTION) ×2 IMPLANT
PACK LAP CHOLE LZT030E (CUSTOM PROCEDURE TRAY) ×2 IMPLANT
PAD ARMBOARD 7.5X6 YLW CONV (MISCELLANEOUS) ×2 IMPLANT
POUCH SPECIMEN RETRIEVAL 10MM (ENDOMECHANICALS) ×2 IMPLANT
SET BASIN LINEN APH (SET/KITS/TRAYS/PACK) ×2 IMPLANT
SET TUBE IRRIG SUCTION NO TIP (IRRIGATION / IRRIGATOR) IMPLANT
SLEEVE ENDOPATH XCEL 5M (ENDOMECHANICALS) ×2 IMPLANT
SPONGE GAUZE 2X2 8PLY STRL LF (GAUZE/BANDAGES/DRESSINGS) ×8 IMPLANT
STAPLER VISISTAT (STAPLE) ×2 IMPLANT
SUT VICRYL 0 UR6 27IN ABS (SUTURE) ×2 IMPLANT
TAPE CLOTH SURG 4X10 WHT LF (GAUZE/BANDAGES/DRESSINGS) ×2 IMPLANT
TROCAR ENDO BLADELESS 11MM (ENDOMECHANICALS) ×2 IMPLANT
TROCAR XCEL NON-BLD 5MMX100MML (ENDOMECHANICALS) ×2 IMPLANT
TROCAR XCEL UNIV SLVE 11M 100M (ENDOMECHANICALS) ×2 IMPLANT
TUBING INSUFFLATION (TUBING) ×2 IMPLANT
WARMER LAPAROSCOPE (MISCELLANEOUS) ×2 IMPLANT
YANKAUER SUCT 12FT TUBE ARGYLE (SUCTIONS) ×2 IMPLANT

## 2013-08-23 NOTE — Anesthesia Procedure Notes (Signed)
Procedure Name: Intubation Date/Time: 08/23/2013 10:16 AM Performed by: Charmaine Downs Pre-anesthesia Checklist: Suction available, Patient being monitored, Emergency Drugs available and Patient identified Patient Re-evaluated:Patient Re-evaluated prior to inductionOxygen Delivery Method: Circle system utilized Preoxygenation: Pre-oxygenation with 100% oxygen Intubation Type: IV induction Ventilation: Mask ventilation without difficulty and Oral airway inserted - appropriate to patient size Laryngoscope Size: Mac and 3 Grade View: Grade I Tube type: Oral Number of attempts: 1 Airway Equipment and Method: Stylet Placement Confirmation: ETT inserted through vocal cords under direct vision,  positive ETCO2 and breath sounds checked- equal and bilateral Secured at: 22 cm Tube secured with: Tape Dental Injury: Teeth and Oropharynx as per pre-operative assessment

## 2013-08-23 NOTE — Anesthesia Postprocedure Evaluation (Signed)
  Anesthesia Post-op Note  Patient: Christina Arroyo  Procedure(s) Performed: Procedure(s): LAPAROSCOPIC CHOLECYSTECTOMY (N/A)  Patient Location: PACU  Anesthesia Type:General  Level of Consciousness: awake, alert , oriented and patient cooperative  Airway and Oxygen Therapy: Patient Spontanous Breathing  Post-op Pain: 2 /10, mild  Post-op Assessment: Post-op Vital signs reviewed, Patient's Cardiovascular Status Stable, Respiratory Function Stable, Patent Airway, No signs of Nausea or vomiting and Pain level controlled  Post-op Vital Signs: Reviewed and stable  Last Vitals:  Filed Vitals:   08/23/13 1117  BP:   Temp: 36.8 C  Resp: 16    Complications: No apparent anesthesia complications

## 2013-08-23 NOTE — Anesthesia Preprocedure Evaluation (Signed)
Anesthesia Evaluation  Patient identified by MRN, date of birth, ID band Patient awake    Reviewed: Allergy & Precautions  Airway Mallampati: II TM Distance: >3 FB Neck ROM: Full    Dental  (+) Teeth Intact   Pulmonary neg pulmonary ROS,  breath sounds clear to auscultation        Cardiovascular negative cardio ROS  Rhythm:Regular Rate:Normal     Neuro/Psych    GI/Hepatic negative GI ROS,   Endo/Other  Morbid obesity  Renal/GU      Musculoskeletal   Abdominal   Peds  Hematology   Anesthesia Other Findings   Reproductive/Obstetrics                           Anesthesia Physical Anesthesia Plan  ASA: II  Anesthesia Plan: General   Post-op Pain Management:    Induction: Intravenous  Airway Management Planned: Oral ETT  Additional Equipment:   Intra-op Plan:   Post-operative Plan: Extubation in OR  Informed Consent: I have reviewed the patients History and Physical, chart, labs and discussed the procedure including the risks, benefits and alternatives for the proposed anesthesia with the patient or authorized representative who has indicated his/her understanding and acceptance.     Plan Discussed with:   Anesthesia Plan Comments:         Anesthesia Quick Evaluation

## 2013-08-23 NOTE — Transfer of Care (Signed)
Immediate Anesthesia Transfer of Care Note  Patient: Christina Arroyo  Procedure(s) Performed: Procedure(s): LAPAROSCOPIC CHOLECYSTECTOMY (N/A)  Patient Location: PACU  Anesthesia Type:General  Level of Consciousness: awake and patient cooperative  Airway & Oxygen Therapy: Patient Spontanous Breathing and Patient connected to face mask oxygen  Post-op Assessment: Report given to PACU RN, Post -op Vital signs reviewed and stable and Patient moving all extremities  Post vital signs: Reviewed and stable  Complications: No apparent anesthesia complications

## 2013-08-23 NOTE — Interval H&P Note (Signed)
History and Physical Interval Note:  08/23/2013 9:58 AM  [name]  has presented today for surgery, with the diagnosis of cholelithiasis  The various methods of treatment have been discussed with the patient and family. After consideration of risks, benefits and other options for treatment, the patient has consented to  Procedure(s): LAPAROSCOPIC CHOLECYSTECTOMY (N/A) as a surgical intervention .  The patient's history has been reviewed, patient examined, no change in status, stable for surgery.  I have reviewed the patient's chart and labs.  Questions were answered to the patient's satisfaction.     Christina Arroyo

## 2013-08-23 NOTE — Op Note (Signed)
Patient:  Christina Arroyo  DOB:  Sep 16, 1970  MRN:  [id number]   Preop Diagnosis:  Cholecystitis, cholelithiasis  Postop Diagnosis:  Same  Procedure:  Laparoscopic cholecystectomy  Surgeon:  Aviva Signs, M.D.  Anes:  General endotracheal  Indications:  Patient is a 43 year old black female presents with biliary colic secondary to cholelithiasis. The risks and benefits of the procedure including bleeding, infection, hepatobiliary injury, and the possibility of an open procedure were fully explained to the patient, who gave informed consent.  Procedure note:  The patient is placed the supine position. After induction of general endotracheal anesthesia, the abdomen was prepped and draped using usual sterile technique with DuraPrep. Surgical site confirmation was performed.  A supraumbilical incision was made down the fascia. A Veress needle was introduced into the abdominal cavity and confirmation of placement was done using the saline drop test. The abdomen was then insufflated to 16 mm mercury pressure. An 11 mm trocar was introduced into the abdominal cavity under direct visualization without difficulty. The patient was placed in reverse Trendelenburg position and additional 11 mm trocar was placed the epigastric region and 5 mm trochars were placed the right upper quadrant and right flank regions. The liver was inspected and noted within normal limits. The gallbladder was retracted in a dynamic fashion in order to expose the triangle of Calot. The cystic duct was first identified. Its junction to the infundibulum was fully identified. Endoclips were placed proximally and distally on the cystic duct, and the cystic duct was divided. This was likewise done cystic artery. The gallbladder was then freed away from the gallbladder fossa using Bovie electrocautery. The gallbladder was delivered through the epigastric trocar site using an Endo Catch bag. The gallbladder fossa was inspected no abnormal  bleeding or bile leakage was noted. Surgicel is placed the gallbladder fossa. All fluid and air were then evacuated from the abdominal cavity prior to removal of the trochars.  All wounds were irrigated with normal saline. All wounds were injected with 0.5% Sensorcaine. The supraumbilical fascia as well as epigastric fascia reapproximated using 0 Vicryl interrupted sutures. All skin incisions were closed using staples. Betadine ointment and dry sterile dressings were applied.  All tape and needle counts were correct at the end of the procedure. Patient was extubated in the operating room and transferred to PACU in stable condition.  Complications:  None  EBL:  Minimal  Specimen:  Gallbladder

## 2013-08-23 NOTE — Discharge Instructions (Signed)
Laparoscopic Cholecystectomy, Care After °Refer to this sheet in the next few weeks. These instructions provide you with information on caring for yourself after your procedure. Your health care provider may also give you more specific instructions. Your treatment has been planned according to current medical practices, but problems sometimes occur. Call your health care provider if you have any problems or questions after your procedure. °WHAT TO EXPECT AFTER THE PROCEDURE °After your procedure, it is typical to have the following: °· Pain at your incision sites. You will be given pain medicines to control the pain. °· Mild nausea or vomiting. This should improve after the first 24 hours. °· Bloating and possibly shoulder pain from the gas used during the procedure. This will improve after the first 24 hours. °HOME CARE INSTRUCTIONS  °· Change bandages (dressings) as directed by your health care provider. °· Keep the wound dry and clean. You may wash the wound gently with soap and water. Gently blot or dab the area dry. °· Do not take baths or use swimming pools or hot tubs for 2 weeks or until your health care provider approves. °· Only take over-the-counter or prescription medicines as directed by your health care provider. °· Continue your normal diet as directed by your health care provider. °· Do not lift anything heavier than 10 pounds (4.5 kg) until your health care provider approves. °· Do not play contact sports for 1 week or until your health care provider approves. °SEEK MEDICAL CARE IF:  °· You have redness, swelling, or increasing pain in the wound. °· You notice yellowish-white fluid (pus) coming from the wound. °· You have drainage from the wound that lasts longer than 1 day. °· You notice a bad smell coming from the wound or dressing. °· Your surgical cuts (incisions) break open. °SEEK IMMEDIATE MEDICAL CARE IF:  °· You develop a rash. °· You have difficulty breathing. °· You have chest pain. °· You  have a fever. °· You have increasing pain in the shoulders (shoulder strap areas). °· You have dizzy episodes or faint while standing. °· You have severe abdominal pain. °· You feel sick to your stomach (nauseous) or throw up (vomit) and this lasts for more than 1 day. °Document Released: 05/04/2005 Document Revised: 02/22/2013 Document Reviewed: 12/14/2012 °ExitCare® Patient Information ©2014 [employer]. ° °

## 2013-08-25 ENCOUNTER — Encounter (HOSPITAL_COMMUNITY): Payer: Self-pay | Admitting: General Surgery

## 2013-10-04 ENCOUNTER — Other Ambulatory Visit (HOSPITAL_COMMUNITY): Payer: Self-pay | Admitting: Physician Assistant

## 2013-10-04 DIAGNOSIS — Z1231 Encounter for screening mammogram for malignant neoplasm of breast: Secondary | ICD-10-CM

## 2013-10-12 ENCOUNTER — Ambulatory Visit (HOSPITAL_COMMUNITY)
Admission: RE | Admit: 2013-10-12 | Discharge: 2013-10-12 | Disposition: A | Discharge status: Home / Self Care | Payer: MEDICAID | Source: Ambulatory Visit | Attending: Physician Assistant | Admitting: Physician Assistant

## 2013-10-12 DIAGNOSIS — Z1231 Encounter for screening mammogram for malignant neoplasm of breast: Secondary | ICD-10-CM

## 2014-11-12 ENCOUNTER — Encounter: Payer: Self-pay | Admitting: Family Medicine

## 2014-11-12 DIAGNOSIS — I10 Essential (primary) hypertension: Secondary | ICD-10-CM | POA: Insufficient documentation

## 2014-11-22 ENCOUNTER — Ambulatory Visit (INDEPENDENT_AMBULATORY_CARE_PROVIDER_SITE_OTHER): Payer: BLUE CROSS/BLUE SHIELD | Admitting: Physician Assistant

## 2014-11-22 ENCOUNTER — Encounter: Payer: Self-pay | Admitting: Physician Assistant

## 2014-11-22 VITALS — BP 128/90 | HR 80 | Temp 98.6°F | Resp 18 | Ht 60.0 in | Wt 218.0 lb

## 2014-11-22 DIAGNOSIS — Z0000 Encounter for general adult medical examination without abnormal findings: Secondary | ICD-10-CM

## 2014-11-22 DIAGNOSIS — Z23 Encounter for immunization: Secondary | ICD-10-CM

## 2014-11-22 DIAGNOSIS — Z800 Family history of malignant neoplasm of digestive organs: Secondary | ICD-10-CM | POA: Diagnosis not present

## 2014-11-22 DIAGNOSIS — I10 Essential (primary) hypertension: Secondary | ICD-10-CM

## 2014-11-22 MED ORDER — LISINOPRIL 10 MG PO TABS: 10.0000 mg | ORAL_TABLET | Freq: Every day | ORAL | Status: DC

## 2014-11-22 NOTE — Progress Notes (Signed)
Patient ID: Christina Arroyo MRN: [id number], DOB: 05-Jun-1970, 44 y.o. Date of Encounter: 11/22/2014,   Chief Complaint: Physical (CPE)  HPI: 44 y.o. y/o AA female  here for CPE.   She is also being seen as a new patient to establish care with [place].  She is married with 3 children. They are 17,19, and 20. She says that the 44 year old is still at home but should graduate from high school this upcoming year. The other 2 are in college--one is at [employer] and one is at [employer]. 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 She says that she is off on Thursdays.  She says that she has lived in [place] "all her life". Says that her employer is now providing her insurance. Says in the past she was going to the [facility]. Says that she last had a "complete physical "with them 08/2013. Says that at that time they did a Pap Smear, which was normal. Also says that she had a mammogram performed at [facility] in either 08/2013 or 09/2013, which was normal.  Says that the main thing she was concerned about addressing today-- is that she checks her blood pressure and gets high readings of around 140/90 or around 140s over 100.  No other complaints or concerns today.      Review of Systems: Consitutional: No fever, chills, fatigue, night sweats, lymphadenopathy. No significant/unexplained weight changes. Eyes: No visual changes, eye redness, or discharge. ENT/Mouth: No ear pain, sore throat, nasal drainage, or sinus pain. Cardiovascular: No chest pressure,heaviness, tightness or squeezing, even with exertion. No increased shortness of breath or dyspnea on exertion.No palpitations, edema, orthopnea, PND. Respiratory: No cough, hemoptysis, SOB, or wheezing. Gastrointestinal: No anorexia, dysphagia, reflux, pain, nausea, vomiting, hematemesis, diarrhea, constipation, BRBPR, or melena. Breast: No mass, nodules, bulging, or retraction. No skin  changes or inflammation. No nipple discharge. No lymphadenopathy. Genitourinary: No dysuria, hematuria, incontinence, vaginal discharge, pruritis, burning, abnormal bleeding, or pain. Musculoskeletal: No decreased ROM, No joint pain or swelling. No significant pain in neck, back, or extremities. Skin: No rash, pruritis, or concerning lesions. Neurological: No headache, dizziness, syncope, seizures, tremors, memory loss, coordination problems, or paresthesias. Psychological: No anxiety, depression, hallucinations, SI/HI. Endocrine: No polydipsia, polyphagia, polyuria, or known diabetes.No increased fatigue. No palpitations/rapid heart rate. No significant/unexplained weight change. All other systems were reviewed and are otherwise negative.  Past Medical History  Diagnosis Date  . Gall stones   . Hypertension     no has diagnosed at HTN     Past Surgical History  Procedure Laterality Date  . Cholecystectomy N/A 08/23/2013    Procedure: LAPAROSCOPIC CHOLECYSTECTOMY;  Surgeon: Jamesetta So, MD;  Location: [facility];  Service: General;  Laterality: N/A;  . Tubal ligation  08/1997    Home Meds:  Outpatient Prescriptions Prior to Visit  Medication Sig Dispense Refill  . Multiple Vitamin (MULTIVITAMIN) capsule Take 1 capsule by mouth daily.    Marland Kitchen oxyCODONE-acetaminophen (PERCOCET) 5-325 MG per tablet Take 1-2 tablets by mouth every 4 (four) hours as needed for severe pain. (Patient not taking: Reported on 11/22/2014) 50 tablet 0  . promethazine (PHENERGAN) 25 MG tablet Take 1 tablet (25 mg total) by mouth every 6 (six) hours as needed for nausea. (Patient not taking: Reported on 11/22/2014) 20 tablet 0   No facility-administered medications prior to visit.    Allergies: No Known Allergies  History   Social  History  . Marital Status: Married    Spouse Name: N/A  . Number of Children: N/A  . Years of Education: N/A   Occupational History  . Not on file.   Social History Main Topics  .  Smoking status: Never Smoker   . Smokeless tobacco: Never Used  . Alcohol Use: No  . Drug Use: No  . Sexual Activity: Yes    Birth Control/ Protection: Surgical   Other Topics Concern  . Not on file   Social History Narrative   Entered 11/2014:    Married. 3 children--Ages 17,19,20 y/o.   ----18 y/o still at home--should graduate HS upcoming year.   --Other 2 are in colleg--1 at [employer]. 1 at [employer].       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         Family History  Problem Relation Age of Onset  . Hypertension Father   . Cancer Father 58    colon and prostrate  . Pulmonary embolism Mother   . Cancer Paternal Grandfather     Physical Exam: Blood pressure 128/90, pulse 80, temperature 98.6 F (37 C), temperature source Oral, resp. rate 18, height 5' (1.524 m), weight 218 lb (98.884 kg), last menstrual period 11/17/2014., Body mass index is 42.58 kg/(m^2). General: Obese AAF. Appears in no acute distress. HEENT: Normocephalic, atraumatic. Conjunctiva pink, sclera non-icteric. Pupils 2 mm constricting to 1 mm, round, regular, and equally reactive to light and accomodation. EOMI. Internal auditory canal clear. TMs with good cone of light and without pathology. Nasal mucosa pink. Nares are without discharge. No sinus tenderness. Oral mucosa pink.  Pharynx without exudate.   Neck: Supple. Trachea midline. No thyromegaly. Full ROM. No lymphadenopathy.No Carotid Bruits. Lungs: Clear to auscultation bilaterally without wheezes, rales, or rhonchi. Breathing is of normal effort and unlabored. Cardiovascular: RRR with S1 S2. No murmurs, rubs, or gallops. Distal pulses 2+ symmetrically. No carotid or abdominal bruits. Breast: Symmetrical. No masses. Nipples without discharge. Abdomen: Soft, non-tender, non-distended with normoactive bowel sounds. No hepatosplenomegaly or masses. No rebound/guarding. No CVA tenderness. No hernias.    Genitourinary:  External genitalia without lesions. Vaginal mucosa pink.No discharge present. Cervix pink and without discharge. No cervical tenderness.Normal uterus size. No adnexal mass or tenderness. Musculoskeletal: Full range of motion and 5/5 strength throughout. Without swelling, atrophy, tenderness, crepitus, or warmth. Extremities without clubbing, cyanosis, or edema. Calves supple. Skin: Warm and moist without erythema, ecchymosis, wounds, or rash. Neuro: A+Ox3. CN II-XII grossly intact. Moves all extremities spontaneously. Full sensation throughout. Normal gait. DTR 2+ throughout upper and lower extremities. Finger to nose intact. Psych:  Responds to questions appropriately with a normal affect.   Assessment/Plan:  44 y.o. y/o female here for CPE  1. Visit for preventive health examination  A. Screening Labs: She is not fasting today but says that she is off on Thursdays and can return this upcoming Thursday fasting for lab work. - CBC with Differential/Platelet; Future - COMPLETE METABOLIC PANEL WITH GFR; Future - Lipid panel; Future - TSH; Future - Vit D  25 hydroxy (rtn osteoporosis monitoring); Future   B. Pap: She states that she had Pap smear performed 08/2013 and was normal. Pelvic exam performed today but can wait to repeat Pap smear.  C. Screening Mammogram: She states that she had mammogram performed at [facility] in either April or May 2015 and was negative. She is agreeable for me to schedule  her for follow-up mammogram. - MM Digital Screening; Future  D. DEXA/BMD:  This does not need to performed until around age 38.  E. Colorectal Cancer Screening: Her father was diagnosed with colon cancer at age 62. Patient is currently age 41. Therefore we'll go ahead and refer her to GI to see if she needs to go ahead and start screening colonoscopy, as she is 10 years less than his age at diagnosis. Discussed this with her today and she is agreeable - Ambulatory  referral to Gastroenterology  F. Immunizations:  Influenza:---N/A--July Tetanus:--- She has had no tetanus in > 10 years. Agreeable to update today.  Tdap--given here 11/22/2014 Pneumococcal:  She has no indication to require a pneumonia vaccine until age 38. Zostavax: Not indicated until age 44.   2. Family history of colon cancer See #1-E Above.  - Ambulatory referral to Gastroenterology  3. Essential hypertension She reports checking her blood pressure herself and getting 140s over 90s to 100s.  I reviewed in [place] BMET performed 08/2013 which was normal.  Will start lisinopril 10 mg daily. She plans to return for lab work in one week.  She will continue to check her blood pressure and monitor this.  - lisinopril (PRINIVIL,ZESTRIL) 10 MG tablet; Take 1 tablet (10 mg total) by mouth daily.  Dispense: 30 tablet; Refill: 0  She is to schedule a follow-up office visit here in about 3 weeks' time to follow-up her blood pressure and make sure that all of the other things have had adequate follow-up.  After that, can just schedule follow-up routine visits every 6 months.  [address] [place], [place], [facility] 11/22/2014 4:55 PM

## 2014-11-29 ENCOUNTER — Other Ambulatory Visit: Payer: BLUE CROSS/BLUE SHIELD

## 2014-11-29 DIAGNOSIS — Z0000 Encounter for general adult medical examination without abnormal findings: Secondary | ICD-10-CM

## 2014-11-29 LAB — COMPLETE METABOLIC PANEL WITH GFR
ALT: 19 U/L (ref 0–35)
AST: 12 U/L (ref 0–37)
Albumin: 3.9 g/dL (ref 3.5–5.2)
Alkaline Phosphatase: 50 U/L (ref 39–117)
BUN: 11 mg/dL (ref 6–23)
CO2: 25 mEq/L (ref 19–32)
Calcium: 9.4 mg/dL (ref 8.4–10.5)
Chloride: 104 mEq/L (ref 96–112)
Creat: 0.72 mg/dL (ref 0.50–1.10)
GFR, Est African American: >89 mL/min
GFR, Est Non African American: >89 mL/min
Glucose, Bld: 104 mg/dL — ABNORMAL HIGH (ref 70–99)
Potassium: 4.5 mEq/L (ref 3.5–5.3)
Sodium: 137 mEq/L (ref 135–145)
Total Bilirubin: 0.3 mg/dL (ref 0.2–1.2)
Total Protein: 7.1 g/dL (ref 6.0–8.3)

## 2014-11-29 LAB — LIPID PANEL
Cholesterol: 193 mg/dL (ref 0–200)
HDL: 71 mg/dL (ref >=46)
LDL Cholesterol: 110 mg/dL — ABNORMAL HIGH (ref 0–99)
Total CHOL/HDL Ratio: 2.7 Ratio
Triglycerides: 60 mg/dL (ref <150)
VLDL: 12 mg/dL (ref 0–40)

## 2014-11-29 LAB — TSH: TSH: 1.302 u[IU]/mL (ref 0.350–4.500)

## 2014-11-30 ENCOUNTER — Telehealth: Payer: Self-pay | Admitting: *Deleted

## 2014-11-30 ENCOUNTER — Other Ambulatory Visit: Payer: Self-pay | Admitting: Family Medicine

## 2014-11-30 DIAGNOSIS — E559 Vitamin D deficiency, unspecified: Secondary | ICD-10-CM

## 2014-11-30 LAB — CBC WITH DIFFERENTIAL/PLATELET
Basophils Absolute: 0.1 10*3/uL (ref 0.0–0.1)
Basophils Relative: 1 % (ref 0–1)
Eosinophils Absolute: 0.1 10*3/uL (ref 0.0–0.7)
Eosinophils Relative: 2 % (ref 0–5)
HCT: 37.6 % (ref 36.0–46.0)
Hemoglobin: 12.2 g/dL (ref 12.0–15.0)
Lymphocytes Relative: 37 % (ref 12–46)
Lymphs Abs: 2.4 10*3/uL (ref 0.7–4.0)
MCH: 26.3 pg (ref 26.0–34.0)
MCHC: 32.4 g/dL (ref 30.0–36.0)
MCV: 81.2 fL (ref 78.0–100.0)
MPV: 9.3 fL (ref 8.6–12.4)
Monocytes Absolute: 0.6 10*3/uL (ref 0.1–1.0)
Monocytes Relative: 10 % (ref 3–12)
Neutro Abs: 3.2 10*3/uL (ref 1.7–7.7)
Neutrophils Relative %: 50 % (ref 43–77)
Platelets: 370 10*3/uL (ref 150–400)
RBC: 4.63 MIL/uL (ref 3.87–5.11)
RDW: 15.3 % (ref 11.5–15.5)
WBC: 6.4 10*3/uL (ref 4.0–10.5)

## 2014-11-30 LAB — VITAMIN D 25 HYDROXY (VIT D DEFICIENCY, FRACTURES): Vit D, 25-Hydroxy: 9 ng/mL — ABNORMAL LOW (ref 30–100)

## 2014-11-30 MED ORDER — VITAMIN D (ERGOCALCIFEROL) 1.25 MG (50000 UNIT) PO CAPS: 50000.0000 [IU] | ORAL_CAPSULE | ORAL | Status: DC

## 2014-11-30 NOTE — Telephone Encounter (Signed)
Pt has appt scheduled at [facility] on Thursday July 28th at 10:30am, lmtrc to pt, will also send letter to inform pt of appt

## 2014-12-04 ENCOUNTER — Telehealth: Payer: Self-pay | Admitting: Physician Assistant

## 2014-12-04 NOTE — Telephone Encounter (Signed)
[place] [facility]  Patient says Christina Arroyo prescribed her lisinopril and it is making her constipated  Would like to know if anything can be prescribed   [phone] if any questions

## 2014-12-04 NOTE — Telephone Encounter (Signed)
Pt aware of apt.  

## 2014-12-04 NOTE — Telephone Encounter (Signed)
lmtrc

## 2014-12-07 NOTE — Telephone Encounter (Signed)
Patient calling again regarding this.

## 2014-12-07 NOTE — Telephone Encounter (Signed)
Pt feels since starting Lisinopril has been constipated.  Has been using OTC softener.  Told to increase fiber and daily fluids.  Also maybe try softener w/laxative.  Is leaving for vacation next week.  Please advise?  Can leave her voice message.

## 2014-12-07 NOTE — Telephone Encounter (Signed)
lmtcb

## 2014-12-10 MED ORDER — LINACLOTIDE 145 MCG PO CAPS: 145.0000 ug | ORAL_CAPSULE | Freq: Every day | ORAL | Status: DC

## 2014-12-10 NOTE — Telephone Encounter (Signed)
I really do not think it is the lisinopril causing the constipation.  Continue lisinopril.  Send Rx Linzess 12mcg QD #30+5 ---take this each morning prior to food/drink.--for constipation.

## 2014-12-10 NOTE — Telephone Encounter (Signed)
Called pt and made aware of RX.

## 2014-12-13 ENCOUNTER — Ambulatory Visit (HOSPITAL_COMMUNITY): Payer: Medicaid Other

## 2014-12-17 ENCOUNTER — Ambulatory Visit (HOSPITAL_COMMUNITY)
Admission: RE | Admit: 2014-12-17 | Discharge: 2014-12-17 | Disposition: A | Discharge status: Home / Self Care | Payer: BLUE CROSS/BLUE SHIELD | Source: Ambulatory Visit | Attending: Physician Assistant | Admitting: Physician Assistant

## 2014-12-17 DIAGNOSIS — Z0000 Encounter for general adult medical examination without abnormal findings: Secondary | ICD-10-CM

## 2014-12-17 DIAGNOSIS — Z1231 Encounter for screening mammogram for malignant neoplasm of breast: Secondary | ICD-10-CM | POA: Insufficient documentation

## 2014-12-24 ENCOUNTER — Other Ambulatory Visit: Payer: Self-pay | Admitting: Family Medicine

## 2014-12-24 DIAGNOSIS — I10 Essential (primary) hypertension: Secondary | ICD-10-CM

## 2014-12-24 MED ORDER — LISINOPRIL 10 MG PO TABS: 10.0000 mg | ORAL_TABLET | Freq: Every day | ORAL | Status: DC

## 2014-12-24 NOTE — Telephone Encounter (Signed)
Medication refilled per protocol. 

## 2015-05-15 ENCOUNTER — Encounter: Payer: Self-pay | Admitting: Physician Assistant

## 2015-05-15 ENCOUNTER — Ambulatory Visit (INDEPENDENT_AMBULATORY_CARE_PROVIDER_SITE_OTHER): Payer: BLUE CROSS/BLUE SHIELD | Admitting: Physician Assistant

## 2015-05-15 VITALS — BP 132/94 | HR 84 | Temp 98.5°F | Resp 18 | Wt 226.0 lb

## 2015-05-15 DIAGNOSIS — I10 Essential (primary) hypertension: Secondary | ICD-10-CM | POA: Diagnosis not present

## 2015-05-15 DIAGNOSIS — K5900 Constipation, unspecified: Secondary | ICD-10-CM | POA: Diagnosis not present

## 2015-05-15 DIAGNOSIS — E559 Vitamin D deficiency, unspecified: Secondary | ICD-10-CM

## 2015-05-15 DIAGNOSIS — K5909 Other constipation: Secondary | ICD-10-CM | POA: Insufficient documentation

## 2015-05-15 DIAGNOSIS — Z800 Family history of malignant neoplasm of digestive organs: Secondary | ICD-10-CM | POA: Diagnosis not present

## 2015-05-15 MED ORDER — LISINOPRIL 20 MG PO TABS: 20.0000 mg | ORAL_TABLET | Freq: Every day | ORAL | Status: DC

## 2015-05-15 NOTE — Progress Notes (Signed)
Patient ID: Christina Arroyo MRN: [id number], DOB: 1970-07-30, 44 y.o. Date of Encounter: 05/15/2015,   Chief Complaint: F/U Hypertension  HPI: 44 y.o. y/o AA female  here for above.  11/22/2014:  She is here for CPE and also being seen as a new patient to establish care with [place].  She is married with 3 children. They are 17,19, and 20. She says that the 44 year old is still at home but should graduate from high school this upcoming year. The other 2 are in college--one is at [employer] and one is at [employer]. 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 She says that she is off on Thursdays.  She says that she has lived in [place] "all her life". Says that her employer is now providing her insurance. Says in the past she was going to the [facility]. Says that she last had a "complete physical "with them 08/2013. Says that at that time they did a Pap Smear, which was normal. Also says that she had a mammogram performed at [facility] in either 08/2013 or 09/2013, which was normal.  Says that the main thing she was concerned about addressing today-- is that she checks her blood pressure and gets high readings of around 140/90 or around 140s over 100.  At that visit-- I prescribed lisinopril 10 mg daily. She was to return for labs and blood pressure check 1 week later. She did return for lab work but I do not see that she return to check blood pressure here.   05/15/2015: She reports that she has been taking the lisinopril 10 mg daily. He reports that she has been checking her blood pressure at home and is getting similar readings as what we are getting today which is 132/94. He says that the diastolic is always in the 90s when she checks it at home. Has no other complaints or concerns today.     Review of Systems: Consitutional: No fever, chills, fatigue, night sweats, lymphadenopathy. No significant/unexplained weight  changes. Eyes: No visual changes, eye redness, or discharge. ENT/Mouth: No ear pain, sore throat, nasal drainage, or sinus pain. Cardiovascular: No chest pressure,heaviness, tightness or squeezing, even with exertion. No increased shortness of breath or dyspnea on exertion.No palpitations, edema, orthopnea, PND. Respiratory: No cough, hemoptysis, SOB, or wheezing. Gastrointestinal: No anorexia, dysphagia, reflux, pain, nausea, vomiting, hematemesis, diarrhea, constipation, BRBPR, or melena. Breast: No mass, nodules, bulging, or retraction. No skin changes or inflammation. No nipple discharge. No lymphadenopathy. Genitourinary: No dysuria, hematuria, incontinence, vaginal discharge, pruritis, burning, abnormal bleeding, or pain. Musculoskeletal: No decreased ROM, No joint pain or swelling. No significant pain in neck, back, or extremities. Skin: No rash, pruritis, or concerning lesions. Neurological: No headache, dizziness, syncope, seizures, tremors, memory loss, coordination problems, or paresthesias. Psychological: No anxiety, depression, hallucinations, SI/HI. Endocrine: No polydipsia, polyphagia, polyuria, or known diabetes.No increased fatigue. No palpitations/rapid heart rate. No significant/unexplained weight change. All other systems were reviewed and are otherwise negative.  Past Medical History  Diagnosis Date  . Gall stones   . Hypertension     no has diagnosed at HTN     Past Surgical History  Procedure Laterality Date  . Cholecystectomy N/A 08/23/2013    Procedure: LAPAROSCOPIC CHOLECYSTECTOMY;  Surgeon: Jamesetta So, MD;  Location: [facility];  Service: General;  Laterality: N/A;  . Tubal ligation  08/1997    Home Meds:  Outpatient Prescriptions Prior to  Visit  Medication Sig Dispense Refill  . Linaclotide (LINZESS) 145 MCG CAPS capsule Take 1 capsule (145 mcg total) by mouth daily. Take each morning before eating/drinking 30 capsule 5  . lisinopril (PRINIVIL,ZESTRIL) 10 MG  tablet Take 1 tablet (10 mg total) by mouth daily. 30 tablet 5  . Vitamin D, Ergocalciferol, (DRISDOL) 50000 UNITS CAPS capsule Take 1 capsule (50,000 Units total) by mouth every 7 (seven) days. 12 capsule 0   No facility-administered medications prior to visit.    Allergies: No Known Allergies  Social History   Social History  . Marital Status: Married    Spouse Name: N/A  . Number of Children: N/A  . Years of Education: N/A   Occupational History  . Not on file.   Social History Main Topics  . Smoking status: Never Smoker   . Smokeless tobacco: Never Used  . Alcohol Use: No  . Drug Use: No  . Sexual Activity: Yes    Birth Control/ Protection: Surgical   Other Topics Concern  . Not on file   Social History Narrative   44 11/2014:    Married. 3 children--Ages 17,19,20 y/o.   ----27 y/o still at home--should graduate HS upcoming year.   --Other 2 are in colleg--1 at [employer] 1 at [employer].       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         Family History  Problem Relation Age of Onset  . Hypertension Father   . Cancer Father 47    colon and prostrate  . Pulmonary embolism Mother   . Cancer Paternal Grandfather     Physical Exam: Blood pressure 132/94, pulse 84, temperature 98.5 F (36.9 C), temperature source Oral, resp. rate 18, weight 226 lb (102.513 kg)., Body mass index is 44.14 kg/(m^2). General: Obese AAF. Appears in no acute distress. Neck: Supple. Trachea midline. No thyromegaly. Full ROM. No lymphadenopathy.No Carotid Bruits. Lungs: Clear to auscultation bilaterally without wheezes, rales, or rhonchi. Breathing is of normal effort and unlabored. Cardiovascular: RRR with S1 S2. No murmurs, rubs, or gallops. Distal pulses 2+ symmetrically. No carotid or abdominal bruits. Musculoskeletal: Full range of motion and 5/5 strength throughout. Skin: Warm and moist without erythema, ecchymosis, wounds, or  rash. Neuro: A+Ox3. CN II-XII grossly intact. Moves all extremities spontaneously. Full sensation throughout. Normal gait. Psych:  Responds to questions appropriately with a normal affect.   Assessment/Plan:  44 y.o. y/o female here for   1. Essential hypertension She states that she can and will return for follow-up visit in 2 weeks to recheck blood pressure and bmet. At this time, will increase lisinopril from 10 mg up to 20 mg daily. She is to have follow-up in 2 weeks to recheck blood pressure and also check BMET on this medication. - lisinopril (PRINIVIL,ZESTRIL) 20 MG tablet; Take 1 tablet (20 mg total) by mouth daily.  Dispense: 30 tablet; Refill: 0  2. Chronic constipation She states that she does not have to take the lenses every single day but does use it about 2-3 times per week, on average.   Vitamin D deficiency --When she turns for follow-up office visit and to recheck BMET, will recheck vitamin D level at that lab.   THE FOLLOWING IS COPIED FROM HER CPE 11/22/2014:  Visit for preventive health examination  A. Screening Labs: She is not fasting today but says that she is off on Thursdays and can return this  upcoming Thursday fasting for lab work. - CBC with Differential/Platelet; Future - COMPLETE METABOLIC PANEL WITH GFR; Future - Lipid panel; Future - TSH; Future - Vit D  25 hydroxy (rtn osteoporosis monitoring); Future   B. Pap: She states that she had Pap smear performed 08/2013 and was normal. Pelvic exam performed today but can wait to repeat Pap smear.  C. Screening Mammogram: She states that she had mammogram performed at [facility] in either April or May 2015 and was negative. She is agreeable for me to schedule her for follow-up mammogram. - MM Digital Screening; Future  D. DEXA/BMD:  This does not need to performed until around age 3.  E. Colorectal Cancer Screening: Her father was diagnosed with colon cancer at age 81. Patient is currently age  12. Therefore we'll go ahead and refer her to GI to see if she needs to go ahead and start screening colonoscopy, as she is 10 years less than his age at diagnosis. Discussed this with her today and she is agreeable - Ambulatory referral to Gastroenterology  F. Immunizations:  Influenza:---N/A--July. Follow-up visit 05/15/15 discussed flu vaccine but she defers. Tetanus:--- She has had no tetanus in > 10 years. Agreeable to update today.  Tdap--given here 11/22/2014 Pneumococcal:  She has no indication to require a pneumonia vaccine until age 21. Zostavax: Not indicated until age 19.   Family history of colon cancer See #1-E Above.  - Ambulatory referral to Gastroenterology   Signed, Olean Ree Kell, [place], [facility] 05/15/2015 2:43 PM

## 2015-05-29 ENCOUNTER — Ambulatory Visit (INDEPENDENT_AMBULATORY_CARE_PROVIDER_SITE_OTHER): Payer: BLUE CROSS/BLUE SHIELD | Admitting: Physician Assistant

## 2015-05-29 ENCOUNTER — Encounter: Payer: Self-pay | Admitting: Physician Assistant

## 2015-05-29 VITALS — BP 124/90 | HR 80 | Temp 98.1°F | Resp 18 | Wt 229.0 lb

## 2015-05-29 DIAGNOSIS — E559 Vitamin D deficiency, unspecified: Secondary | ICD-10-CM | POA: Diagnosis not present

## 2015-05-29 DIAGNOSIS — I10 Essential (primary) hypertension: Secondary | ICD-10-CM

## 2015-05-29 LAB — BASIC METABOLIC PANEL WITH GFR
BUN: 14 mg/dL (ref 7–25)
CO2: 25 mmol/L (ref 20–31)
Calcium: 9.5 mg/dL (ref 8.6–10.2)
Chloride: 100 mmol/L (ref 98–110)
Creat: 0.68 mg/dL (ref 0.50–1.10)
GFR, Est African American: >89 mL/min (ref >=60)
GFR, Est Non African American: >89 mL/min (ref >=60)
Glucose, Bld: 121 mg/dL — ABNORMAL HIGH (ref 70–99)
Potassium: 4.8 mmol/L (ref 3.5–5.3)
Sodium: 134 mmol/L — ABNORMAL LOW (ref 135–146)

## 2015-05-29 MED ORDER — LISINOPRIL 20 MG PO TABS: 20.0000 mg | ORAL_TABLET | Freq: Every day | ORAL | Status: DC

## 2015-05-29 NOTE — Progress Notes (Signed)
Patient ID: Christina Arroyo MRN: [id number], DOB: 08/03/1970, 45 y.o. Date of Encounter: 05/29/2015,   Chief Complaint: F/U Hypertension  HPI: 45 y.o. y/o AA female  here for above.  11/22/2014:  She is here for CPE and also being seen as a new patient to establish care with [place].  She is married with 3 children. They are 17,19, and 20. She says that the 45 year old is still at home but should graduate from high school this upcoming year. The other 2 are in college--one is at [employer] and one is at [employer]. 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 She says that she is off on Thursdays.  She says that she has lived in [place] "all her life". Says that her employer is now providing her insurance. Says in the past she was going to the [facility]. Says that she last had a "complete physical "with them 08/2013. Says that at that time they did a Pap Smear, which was normal. Also says that she had a mammogram performed at [facility] in either 08/2013 or 09/2013, which was normal.  Says that the main thing she was concerned about addressing today-- is that she checks her blood pressure and gets high readings of around 140/90 or around 140s over 100.  At that visit-- I prescribed lisinopril 10 mg daily. She was to return for labs and blood pressure check 1 week later. She did return for lab work but I do not see that she return to check blood pressure here.   05/15/2015: She reports that she has been taking the lisinopril 10 mg daily. He reports that she has been checking her blood pressure at home and is getting similar readings as what we are getting today which is 132/94. He says that the diastolic is always in the 90s when she checks it at home. Has no other complaints or concerns today. At that OV--Increased Lisinopril to 20mg  QD  05/29/2015: She reports she is taking the Lisinopril 20mg  QD. No adverse effects. Has checked BP  once at home--got 118/84.  Reviewed that at lab 11/2014--Vitamin D was very low at 9. Told to take Ergocalciferol 50,000 units once a week for 12 weeks then otc Vit D 4,000 Units QD---Pt says this is what she did--and is taking otc 4,000 QD currently.    Review of Systems: Consitutional: No fever, chills, fatigue, night sweats, lymphadenopathy. No significant/unexplained weight changes. Eyes: No visual changes, eye redness, or discharge. ENT/Mouth: No ear pain, sore throat, nasal drainage, or sinus pain. Cardiovascular: No chest pressure,heaviness, tightness or squeezing, even with exertion. No increased shortness of breath or dyspnea on exertion.No palpitations, edema, orthopnea, PND. Respiratory: No cough, hemoptysis, SOB, or wheezing. Gastrointestinal: No anorexia, dysphagia, reflux, pain, nausea, vomiting, hematemesis, diarrhea, constipation, BRBPR, or melena. Breast: No mass, nodules, bulging, or retraction. No skin changes or inflammation. No nipple discharge. No lymphadenopathy. Genitourinary: No dysuria, hematuria, incontinence, vaginal discharge, pruritis, burning, abnormal bleeding, or pain. Musculoskeletal: No decreased ROM, No joint pain or swelling. No significant pain in neck, back, or extremities. Skin: No rash, pruritis, or concerning lesions. Neurological: No headache, dizziness, syncope, seizures, tremors, memory loss, coordination problems, or paresthesias. Psychological: No anxiety, depression, hallucinations, SI/HI. Endocrine: No polydipsia, polyphagia, polyuria, or known diabetes.No increased fatigue. No palpitations/rapid heart rate. No significant/unexplained weight change. All other systems were reviewed and are otherwise negative.  Past Medical History  Diagnosis Date  .  Gall stones   . Hypertension     no has diagnosed at HTN     Past Surgical History  Procedure Laterality Date  . Cholecystectomy N/A 08/23/2013    Procedure: LAPAROSCOPIC CHOLECYSTECTOMY;   Surgeon: Jamesetta So, MD;  Location: [facility];  Service: General;  Laterality: N/A;  . Tubal ligation  08/1997    Home Meds:  Outpatient Prescriptions Prior to Visit  Medication Sig Dispense Refill  . Cholecalciferol 4000 units CAPS Take 1 capsule by mouth daily.    . Linaclotide (LINZESS) 145 MCG CAPS capsule Take 1 capsule (145 mcg total) by mouth daily. Take each morning before eating/drinking 30 capsule 5  . lisinopril (PRINIVIL,ZESTRIL) 20 MG tablet Take 1 tablet (20 mg total) by mouth daily. 30 tablet 0   No facility-administered medications prior to visit.    Allergies: No Known Allergies  Social History   Social History  . Marital Status: Married    Spouse Name: N/A  . Number of Children: N/A  . Years of Education: N/A   Occupational History  . Not on file.   Social History Main Topics  . Smoking status: Never Smoker   . Smokeless tobacco: Never Used  . Alcohol Use: No  . Drug Use: No  . Sexual Activity: Yes    Birth Control/ Protection: Surgical   Other Topics Concern  . Not on file   Social History Narrative   Entered 11/2014:    Married. 3 children--Ages 17,19,20 y/o.   ----68 y/o still at home--should graduate HS upcoming year.   --Other 2 are in colleg--1 at [employer]. 1 at [employer].       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         Family History  Problem Relation Age of Onset  . Hypertension Father   . Cancer Father 24    colon and prostrate  . Pulmonary embolism Mother   . Cancer Paternal Grandfather     Physical Exam: Blood pressure 124/90, pulse 80, temperature 98.1 F (36.7 C), temperature source Oral, resp. rate 18, weight 229 lb (103.874 kg)., Body mass index is 44.72 kg/(m^2). General: Obese AAF. Appears in no acute distress. Neck: Supple. Trachea midline. No thyromegaly. Full ROM. No lymphadenopathy.No Carotid Bruits. Lungs: Clear to auscultation bilaterally without wheezes, rales, or  rhonchi. Breathing is of normal effort and unlabored. Cardiovascular: RRR with S1 S2. No murmurs, rubs, or gallops. Distal pulses 2+ symmetrically. No carotid or abdominal bruits. Musculoskeletal: Full range of motion and 5/5 strength throughout. Skin: Warm and moist without erythema, ecchymosis, wounds, or rash. Neuro: A+Ox3. CN II-XII grossly intact. Moves all extremities spontaneously. Full sensation throughout. Normal gait. Psych:  Responds to questions appropriately with a normal affect.   Assessment/Plan:  45 y.o. y/o female here for   1. Essential hypertension BP now at goal/controlled. Cont Lisinopril 20mg  QD. Check lab to monitor.   2. Chronic constipation She states that she does not have to take the lenses every single day but does use it about 2-3 times per week, on average.   Vitamin D deficiency Reviewed that at lab 11/2014--Vitamin D was very low at 9. Told to take Ergocalciferol 50,000 units once a week for 12 weeks then otc Vit D 4,000 Units QD- -At [place] 05/29/2015: -Pt says this is what she did--and is taking otc 4,000 QD currently. At OV 05/29/2015---Recheck Vit D level   Routine f/u OV 6 months,  sooner if needed.     THE FOLLOWING IS COPIED FROM HER CPE 11/22/2014:  Visit for preventive health examination  A. Screening Labs: She is not fasting today but says that she is off on Thursdays and can return this upcoming Thursday fasting for lab work. - CBC with Differential/Platelet; Future-----------------------------------------Normal - COMPLETE METABOLIC PANEL WITH GFR; Future------------------Normal - Lipid panel; Future--------------------------------------------------------------------Excellent--Trig-60, HDL- 71,  LDL- 110 - TSH; Future-------------------------------------------------------------------------Normal - Vit D  25 hydroxy (rtn osteoporosis monitoring); Future-------------------9---See Above   B. Pap: She states that she had Pap smear performed 08/2013  and was normal. Pelvic exam performed today but can wait to repeat Pap smear.  C. Screening Mammogram: She states that she had mammogram performed at [facility] in either April or May 2015 and was negative. She is agreeable for me to schedule her for follow-up mammogram. - MM Digital Screening; Future  D. DEXA/BMD:  This does not need to performed until around age 72.  E. Colorectal Cancer Screening: Her father was diagnosed with colon cancer at age 85. Patient is currently age 22. Therefore we'll go ahead and refer her to GI to see if she needs to go ahead and start screening colonoscopy, as she is 10 years less than his age at diagnosis. Discussed this with her today and she is agreeable - Ambulatory referral to Gastroenterology  F. Immunizations:  Influenza:---N/A--July. Follow-up visit 05/15/15 discussed flu vaccine but she defers. Tetanus:--- She has had no tetanus in > 10 years. Agreeable to update today.  Tdap--given here 11/22/2014 Pneumococcal:  She has no indication to require a pneumonia vaccine until age 86. Zostavax: Not indicated until age 49.   Family history of colon cancer See #1-E Above.  - Ambulatory referral to Gastroenterology   Signed, Olean Ree Hardin, [place], [facility] 05/29/2015 8:22 AM

## 2015-05-30 LAB — VITAMIN D 25 HYDROXY (VIT D DEFICIENCY, FRACTURES): Vit D, 25-Hydroxy: 44 ng/mL (ref 30–100)

## 2015-08-23 ENCOUNTER — Encounter: Payer: Self-pay | Admitting: Physician Assistant

## 2015-09-19 ENCOUNTER — Encounter: Payer: Self-pay | Admitting: Physician Assistant

## 2015-09-19 ENCOUNTER — Ambulatory Visit (INDEPENDENT_AMBULATORY_CARE_PROVIDER_SITE_OTHER): Payer: BLUE CROSS/BLUE SHIELD | Admitting: Physician Assistant

## 2015-09-19 VITALS — BP 118/84 | HR 64 | Temp 98.0°F | Resp 18 | Wt 227.0 lb

## 2015-09-19 DIAGNOSIS — K5900 Constipation, unspecified: Secondary | ICD-10-CM | POA: Diagnosis not present

## 2015-09-19 DIAGNOSIS — E559 Vitamin D deficiency, unspecified: Secondary | ICD-10-CM

## 2015-09-19 DIAGNOSIS — I10 Essential (primary) hypertension: Secondary | ICD-10-CM | POA: Diagnosis not present

## 2015-09-19 DIAGNOSIS — R300 Dysuria: Secondary | ICD-10-CM

## 2015-09-19 DIAGNOSIS — K5909 Other constipation: Secondary | ICD-10-CM

## 2015-09-19 DIAGNOSIS — Z800 Family history of malignant neoplasm of digestive organs: Secondary | ICD-10-CM

## 2015-09-19 DIAGNOSIS — N390 Urinary tract infection, site not specified: Secondary | ICD-10-CM

## 2015-09-19 LAB — URINALYSIS, ROUTINE W REFLEX MICROSCOPIC
Bilirubin Urine: NEGATIVE
Glucose, UA: NEGATIVE
Ketones, ur: NEGATIVE
Nitrite: NEGATIVE
Specific Gravity, Urine: 1.02 (ref 1.001–1.035)
pH: 6.5 (ref 5.0–8.0)

## 2015-09-19 LAB — BASIC METABOLIC PANEL WITH GFR
BUN: 14 mg/dL (ref 7–25)
CO2: 24 mmol/L (ref 20–31)
Calcium: 9.1 mg/dL (ref 8.6–10.2)
Chloride: 102 mmol/L (ref 98–110)
Creat: 0.71 mg/dL (ref 0.50–1.10)
GFR, Est African American: >89 mL/min (ref >=60)
GFR, Est Non African American: >89 mL/min (ref >=60)
Glucose, Bld: 119 mg/dL — ABNORMAL HIGH (ref 70–99)
Potassium: 4.3 mmol/L (ref 3.5–5.3)
Sodium: 135 mmol/L (ref 135–146)

## 2015-09-19 LAB — URINALYSIS, MICROSCOPIC ONLY
Casts: NONE SEEN [LPF]
Crystals: NONE SEEN [HPF]
Yeast: NONE SEEN [HPF]

## 2015-09-19 MED ORDER — LINACLOTIDE 72 MCG PO CAPS: 72.0000 ug | ORAL_CAPSULE | Freq: Every day | ORAL | Status: DC

## 2015-09-19 MED ORDER — NITROFURANTOIN MONOHYD MACRO 100 MG PO CAPS: 100.0000 mg | ORAL_CAPSULE | Freq: Two times a day (BID) | ORAL | Status: DC

## 2015-09-19 NOTE — Progress Notes (Signed)
Patient ID: Christina Arroyo MRN: [id number], DOB: 03/21/1971, 45 y.o. Date of Encounter: 09/19/2015,   Chief Complaint: F/U Hypertension  HPI: 45 y.o. y/o AA female  here for above.  11/22/2014:  She is here for CPE and also being seen as a new patient to establish care with [place].  She is married with 3 children. They are 17,19, and 20. She says that the 45 year old is still at home but should graduate from high school this upcoming year. The other 2 are in college--one is at [employer] and one is at [employer]. 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 She says that she is off on Thursdays.  She says that she has lived in [place] "all her life". Says that her employer is now providing her insurance. Says in the past she was going to the [facility]. Says that she last had a "complete physical "with them 08/2013. Says that at that time they did a Pap Smear, which was normal. Also says that she had a mammogram performed at [facility] in either 08/2013 or 09/2013, which was normal.  Says that the main thing she was concerned about addressing today-- is that she checks her blood pressure and gets high readings of around 140/90 or around 140s over 100.  At that visit-- I prescribed lisinopril 10 mg daily. She was to return for labs and blood pressure check 1 week later. She did return for lab work but I do not see that she return to check blood pressure here.   05/15/2015: She reports that she has been taking the lisinopril 10 mg daily. He reports that she has been checking her blood pressure at home and is getting similar readings as what we are getting today which is 132/94. He says that the diastolic is always in the 90s when she checks it at home. Has no other complaints or concerns today. At that OV--Increased Lisinopril to 20mg  QD  05/29/2015: She reports she is taking the Lisinopril 20mg  QD. No adverse effects. Has checked BP  once at home--got 118/84.  Reviewed that at lab 11/2014--Vitamin D was very low at 9. Told to take Ergocalciferol 50,000 units once a week for 12 weeks then otc Vit D 4,000 Units QD---Pt says this is what she did--and is taking otc 4,000 QD currently.  09/19/2015:  She reports that she has felt some dysuria recently. No fever/chills. No back pain. She is taking the lisinopril 20 mg daily. Still checks her blood pressure at home and getting good readings. She is still taking the vitamin D 4000 units daily. Reviewed that at prior OV she reported that she was taking Linzess just 2-3 times per week b/c it was causing diarrhea o/w. At today's Ov, discussed that they now have a lower dose available--changed to the 77 mcg dose.  Says her position changed at work and no longer has a day off during the week.    Review of Systems: Consitutional: No fever, chills, fatigue, night sweats, lymphadenopathy. No significant/unexplained weight changes. Eyes: No visual changes, eye redness, or discharge. ENT/Mouth: No ear pain, sore throat, nasal drainage, or sinus pain. Cardiovascular: No chest pressure,heaviness, tightness or squeezing, even with exertion. No increased shortness of breath or dyspnea on exertion.No palpitations, edema, orthopnea, PND. Respiratory: No cough, hemoptysis, SOB, or wheezing. Gastrointestinal: No anorexia, dysphagia, reflux, pain, nausea, vomiting, hematemesis, diarrhea,  BRBPR, or melena. Breast: No mass, nodules, bulging,  or retraction. No skin changes or inflammation. No nipple discharge. No lymphadenopathy. Genitourinary: No  hematuria, incontinence, vaginal discharge, pruritis, burning, abnormal bleeding, or pain. Musculoskeletal: No decreased ROM, No joint pain or swelling. No significant pain in neck, back, or extremities. Skin: No rash, pruritis, or concerning lesions. Neurological: No headache, dizziness, syncope, seizures, tremors, memory loss, coordination problems, or  paresthesias. Psychological: No anxiety, depression, hallucinations, SI/HI. Endocrine: No polydipsia, polyphagia, polyuria, or known diabetes.No increased fatigue. No palpitations/rapid heart rate. No significant/unexplained weight change. All other systems were reviewed and are otherwise negative.  Past Medical History  Diagnosis Date  . Gall stones   . Hypertension     no has diagnosed at HTN     Past Surgical History  Procedure Laterality Date  . Cholecystectomy N/A 08/23/2013    Procedure: LAPAROSCOPIC CHOLECYSTECTOMY;  Surgeon: Jamesetta So, MD;  Location: [facility];  Service: General;  Laterality: N/A;  . Tubal ligation  08/1997    Home Meds:  Outpatient Prescriptions Prior to Visit  Medication Sig Dispense Refill  . Cholecalciferol 4000 units CAPS Take 1 capsule by mouth daily.    Marland Kitchen lisinopril (PRINIVIL,ZESTRIL) 20 MG tablet Take 1 tablet (20 mg total) by mouth daily. 90 tablet 1  . Linaclotide (LINZESS) 145 MCG CAPS capsule Take 1 capsule (145 mcg total) by mouth daily. Take each morning before eating/drinking 30 capsule 5   No facility-administered medications prior to visit.    Allergies: No Known Allergies  Social History   Social History  . Marital Status: Married    Spouse Name: N/A  . Number of Children: N/A  . Years of Education: N/A   Occupational History  . Not on file.   Social History Main Topics  . Smoking status: Never Smoker   . Smokeless tobacco: Never Used  . Alcohol Use: No  . Drug Use: No  . Sexual Activity: Yes    Birth Control/ Protection: Surgical   Other Topics Concern  . Not on file   Social History Narrative   Entered 11/2014:    Married. 3 children--Ages 17,19,20 y/o.   ----45 y/o still at home--should graduate HS upcoming year.   --Other 2 are in colleg--1 at [employer]. 1 at [employer].       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         Family History  Problem Relation Age of Onset    . Hypertension Father   . Cancer Father 87    colon and prostrate  . Pulmonary embolism Mother   . Cancer Paternal Grandfather     Physical Exam: Blood pressure 118/84, pulse 64, temperature 98 F (36.7 C), temperature source Oral, resp. rate 18, weight 227 lb (102.967 kg)., Body mass index is 44.33 kg/(m^2). General: Obese AAF. Appears in no acute distress. Neck: Supple. Trachea midline. No thyromegaly. Full ROM. No lymphadenopathy.No Carotid Bruits. Lungs: Clear to auscultation bilaterally without wheezes, rales, or rhonchi. Breathing is of normal effort and unlabored. Cardiovascular: RRR with S1 S2. No murmurs, rubs, or gallops.  No carotid  bruits. Musculoskeletal: Full range of motion and 5/5 strength throughout. Skin: Warm and moist without erythema, ecchymosis, wounds, or rash. Neuro: A+Ox3. CN II-XII grossly intact. Moves all extremities spontaneously.  Normal gait. Psych:  Responds to questions appropriately with a normal affect.   Assessment/Plan:  45 y.o. y/o female here for    Dysuria - Urinalysis, Routine w reflex microscopic (not at  [employer]) - Urine culture  Urinary tract infection, site not specified She is to take abx as directed. F/U if symptoms do not resolve. Send culture. - nitrofurantoin, macrocrystal-monohydrate, (MACROBID) 100 MG capsule; Take 1 capsule (100 mg total) by mouth 2 (two) times daily.  Dispense: 10 capsule; Refill: 0  Essential hypertension BP now at goal/controlled. Cont Lisinopril 20mg  QD. Check lab to monitor.   Chronic constipation She states that she does not have to take the Linzess every single day but does use it about 2-3 times per week, on average. At OV 09/19/2015--changed dose to Linzess 62mcg daily - linaclotide (LINZESS) 72 MCG capsule; Take 1 capsule (72 mcg total) by mouth daily before breakfast.  Dispense: 90 capsule; Refill: 2  Vitamin D deficiency Reviewed that at lab 11/2014--Vitamin D was very low at 9. Told to take  Ergocalciferol 50,000 units once a week for 12 weeks then otc Vit D 4,000 Units QD- -At [place] 05/29/2015: -Pt says this is what she did--and is taking otc 4,000 QD currently. At OV 05/29/2015---Recheck Vit D level   Routine f/u OV 6 months, sooner if needed.     THE FOLLOWING IS COPIED FROM HER CPE 11/22/2014:  Visit for preventive health examination  A. Screening Labs: She is not fasting today but says that she is off on Thursdays and can return this upcoming Thursday fasting for lab work. - CBC with Differential/Platelet; Future-----------------------------------------Normal - COMPLETE METABOLIC PANEL WITH GFR; Future------------------Normal - Lipid panel; Future--------------------------------------------------------------------Excellent--Trig-60, HDL- 71,  LDL- 110 - TSH; Future-------------------------------------------------------------------------Normal - Vit D  25 hydroxy (rtn osteoporosis monitoring); Future-------------------9---See Above   B. Pap: She states that she had Pap smear performed 08/2013 and was normal. Pelvic exam performed today but can wait to repeat Pap smear.  C. Screening Mammogram: She states that she had mammogram performed at [facility] in either April or May 2015 and was negative. She is agreeable for me to schedule her for follow-up mammogram. - MM Digital Screening; Future She did have mammogram 12/17/2014  D. DEXA/BMD:  This does not need to performed until around age 82.  E. Colorectal Cancer Screening: Her father was diagnosed with colon cancer at age 82. Patient is currently age 64. Therefore we'll go ahead and refer her to GI to see if she needs to go ahead and start screening colonoscopy, as she is 10 years less than his age at diagnosis. Discussed this with her today and she is agreeable - Ambulatory referral to [facility] 09/19/2015--discussed that she never went to appt. Work schedule changed. Has no day off during week. Agreeable for  me to order Referral again --added to schedule at end of day around 4pm.   F. Immunizations:  Influenza:---N/A--July. Follow-up visit 05/15/15 discussed flu vaccine but she defers. Tetanus:--- She has had no tetanus in > 10 years. Agreeable to update today.  Tdap--given here 11/22/2014 Pneumococcal:  She has no indication to require a pneumonia vaccine until age 62. Zostavax: Not indicated until age 51.   Family history of colon cancer See #1-E Above.  - Ambulatory referral to Gastroenterology   Signed, Olean Ree [place], [place], [facility] 09/19/2015 8:50 AM

## 2015-09-20 LAB — URINE CULTURE: Colony Count: 60000

## 2015-09-20 LAB — VITAMIN D 25 HYDROXY (VIT D DEFICIENCY, FRACTURES): Vit D, 25-Hydroxy: 50 ng/mL (ref 30–100)

## 2015-09-30 ENCOUNTER — Emergency Department (HOSPITAL_COMMUNITY)
Admission: EM | Admit: 2015-09-30 | Discharge: 2015-09-30 | Disposition: A | Discharge status: Home / Self Care | Payer: BLUE CROSS/BLUE SHIELD | Attending: Emergency Medicine | Admitting: Emergency Medicine

## 2015-09-30 ENCOUNTER — Encounter (HOSPITAL_COMMUNITY): Payer: Self-pay

## 2015-09-30 ENCOUNTER — Emergency Department (HOSPITAL_COMMUNITY): Payer: BLUE CROSS/BLUE SHIELD

## 2015-09-30 DIAGNOSIS — M25561 Pain in right knee: Secondary | ICD-10-CM | POA: Diagnosis not present

## 2015-09-30 DIAGNOSIS — S8991XA Unspecified injury of right lower leg, initial encounter: Secondary | ICD-10-CM | POA: Diagnosis not present

## 2015-09-30 DIAGNOSIS — S8001XA Contusion of right knee, initial encounter: Secondary | ICD-10-CM

## 2015-09-30 DIAGNOSIS — Y92410 Unspecified street and highway as the place of occurrence of the external cause: Secondary | ICD-10-CM | POA: Diagnosis not present

## 2015-09-30 DIAGNOSIS — Y999 Unspecified external cause status: Secondary | ICD-10-CM | POA: Insufficient documentation

## 2015-09-30 DIAGNOSIS — Z79899 Other long term (current) drug therapy: Secondary | ICD-10-CM | POA: Insufficient documentation

## 2015-09-30 DIAGNOSIS — S8011XA Contusion of right lower leg, initial encounter: Secondary | ICD-10-CM

## 2015-09-30 DIAGNOSIS — Y939 Activity, unspecified: Secondary | ICD-10-CM | POA: Insufficient documentation

## 2015-09-30 DIAGNOSIS — M25531 Pain in right wrist: Secondary | ICD-10-CM | POA: Insufficient documentation

## 2015-09-30 DIAGNOSIS — I10 Essential (primary) hypertension: Secondary | ICD-10-CM | POA: Diagnosis not present
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
## 2015-10-02 ENCOUNTER — Telehealth: Payer: Self-pay

## 2015-10-02 ENCOUNTER — Other Ambulatory Visit: Payer: Self-pay

## 2015-10-02 DIAGNOSIS — Z800 Family history of malignant neoplasm of digestive organs: Secondary | ICD-10-CM

## 2015-10-02 NOTE — Telephone Encounter (Signed)
[phone]   PT WORK NUMBER   NEEDS TO SCHEDULE TCS     WORKS TILL 5 BUT OK TO CALL

## 2015-10-02 NOTE — Telephone Encounter (Signed)
See separate triage.  

## 2015-10-02 NOTE — Telephone Encounter (Addendum)
Gastroenterology Pre-Procedure Review  Request Date:10/02/2015 Requesting Physician: Dena Billet  PATIENT REVIEW QUESTIONS: The patient responded to the following health history questions as indicated:    1. Diabetes Melitis: no 2. Joint replacements in the past 12 months: no 3. Major health problems in the past 3 months: no 4. Has an artificial valve or MVP: no 5. Has a defibrillator: no 6. Has been advised in past to take antibiotics in advance of a procedure like teeth cleaning: no 7. Family history of colon cancer: YES    Father in early 54's 8. Alcohol Use: no 9. History of sleep apnea: no     MEDICATIONS & ALLERGIES:    Patient reports the following regarding taking any blood thinners:   Plavix? no Aspirin? no Coumadin? no  Patient confirms/reports the following medications:  Current Outpatient Prescriptions  Medication Sig Dispense Refill  . Cholecalciferol 4000 units CAPS Take 1 capsule by mouth daily.    Marland Kitchen lisinopril (PRINIVIL,ZESTRIL) 20 MG tablet Take 1 tablet (20 mg total) by mouth daily. 90 tablet 1  . linaclotide (LINZESS) 72 MCG capsule Take 1 capsule (72 mcg total) by mouth daily before breakfast. (Patient not taking: Reported on 10/02/2015) 90 capsule 2  . nitrofurantoin, macrocrystal-monohydrate, (MACROBID) 100 MG capsule Take 1 capsule (100 mg total) by mouth 2 (two) times daily. (Patient not taking: Reported on 09/30/2015) 10 capsule 0   No current facility-administered medications for this visit.    Patient confirms/reports the following allergies:  No Known Allergies  No orders of the defined types were placed in this encounter.    AUTHORIZATION INFORMATION Primary Insurance:   ID #:   Group #:  Pre-Cert / Auth required:  Pre-Cert / Auth #:   Secondary Insurance:   ID #:   Group #:  Pre-Cert / Auth required:  Pre-Cert / Auth #:   SCHEDULE INFORMATION: Procedure has been scheduled as follows:  Date: 10/28/2015           Time:  8:30 Am Location:  [facility] Short Stay   This Gastroenterology Pre-Precedure Review Form is being routed to the following provider(s): Barney Drain, MD

## 2015-10-04 NOTE — Telephone Encounter (Signed)
PREPOPIK-DRINK WATER TO KEEP URINE LIGHT YELLOW. FULL LIQUIDS WITH BREAKFAST.  Full Liquid Diet A high-calorie, high-protein supplement should be used to meet your nutritional requirements when the full liquid diet is continued for more than 2 or 3 days. If this diet is to be used for an extended period of time (more than 7 days), a multivitamin should be considered.  Breads and Starches  Allowed: None are allowed   Avoid: Any others.    Potatoes/Pasta/Rice  Allowed: ANY ITEM AS A SOUP OR SMALL PLATE OF MASHED POTATOES OR SCRAMBLED EGGS.       Vegetables  Allowed: Strained tomato or vegetable juice. Vegetables pureed in soup.   Avoid: Any others.    Fruit  Allowed: Any strained fruit juices and fruit drinks. Include 1 serving of citrus or vitamin C-enriched fruit juice daily.   Avoid: Any others.  Meat and Meat Substitutes  Allowed: Egg  Avoid: Any meat, fish, or fowl. All cheese.  Milk  Allowed: SOY Milk beverages, including milk shakes and instant breakfast mixes. Smooth yogurt.   Avoid: Any others. Avoid dairy products if not tolerated.    Soups and Combination Foods  Allowed: Broth, strained cream soups. Strained, broth-based soups.   Avoid: Any others.    Desserts and Sweets  Allowed: flavored gelatin, tapioca, ice cream, sherbet, smooth pudding, junket, fruit ices, frozen ice pops, pudding pops, frozen fudge pops, chocolate syrup. Sugar, honey, jelly, syrup.   Avoid: Any others.  Fats and Oils  Allowed: Margarine, butter, cream, sour cream, oils.   Avoid: Any others.  Beverages  Allowed: All.   Avoid: None.  Condiments  Allowed: Iodized salt, pepper, spices, flavorings. Cocoa powder.   Avoid: Any others.    SAMPLE MEAL PLAN Breakfast   cup orange juice.   1 OR 2 EGGS  1 cup milk.   1 cup beverage (coffee or tea).   Cream or sugar, if desired.    Midmorning Snack  2 SCRAMBLED OR HARD BOILED EGG   Lunch  1 cup cream  soup.    cup fruit juice.   1 cup milk.    cup custard.   1 cup beverage (coffee or tea).   Cream or sugar, if desired.    Midafternoon Snack  1 cup milk shake.  Dinner  1 cup cream soup.    cup fruit juice.   1 cup MILK    cup pudding.   1 cup beverage (coffee or tea).   Cream or sugar, if desired.  Evening Snack  1 cup supplement.  To increase calories, add sugar, cream, butter, or margarine if possible. Nutritional supplements will also increase the total calories.

## 2015-10-09 MED ORDER — SOD PICOSULFATE-MAG OX-CIT ACD 10-3.5-12 MG-GM-GM PO PACK: 1.0000 | PACK | ORAL | Status: DC

## 2015-10-09 NOTE — Telephone Encounter (Signed)
Rx sent to the pharmacy and instructions mailed to pt.  

## 2015-10-11 MED ORDER — PEG-KCL-NACL-NASULF-NA ASC-C 100 G PO SOLR: 1.0000 | ORAL | Status: DC

## 2015-10-11 NOTE — Addendum Note (Signed)
Addended by: Everardo All on: 10/11/2015 09:45 AM   Modules accepted: Orders

## 2015-10-11 NOTE — Telephone Encounter (Signed)
Prepopik not covered by insurance. Movie prep sent in. New instructions mailed to pt.  Pt is aware.

## 2015-10-15 MED ORDER — PEG 3350-KCL-NA BICARB-NACL 420 G PO SOLR: 4000.0000 mL | ORAL | Status: DC

## 2015-10-15 NOTE — Addendum Note (Signed)
Addended by: Everardo All on: 10/15/2015 02:25 PM   Modules accepted: Orders

## 2015-10-15 NOTE — Telephone Encounter (Signed)
Movie prep not covered. Sent in trilyte. Pt is aware and new instructions mailed to her.

## 2015-10-16 ENCOUNTER — Telehealth: Payer: Self-pay

## 2015-10-16 NOTE — Telephone Encounter (Signed)
I called [employer] @ [phone] and spoke to [facility] who said PA is not required for the screening colonoscopy as out patient.

## 2015-10-28 ENCOUNTER — Encounter (HOSPITAL_COMMUNITY)
Admission: RE | Disposition: A | Discharge status: Home Health | Payer: Self-pay | Source: Ambulatory Visit | Attending: Gastroenterology

## 2015-10-28 ENCOUNTER — Ambulatory Visit (HOSPITAL_COMMUNITY)
Admission: RE | Admit: 2015-10-28 | Discharge: 2015-10-28 | Disposition: A | Discharge status: Home Health | Payer: BLUE CROSS/BLUE SHIELD | Source: Ambulatory Visit | Attending: Gastroenterology | Admitting: Gastroenterology

## 2015-10-28 ENCOUNTER — Encounter (HOSPITAL_COMMUNITY): Payer: Self-pay

## 2015-10-28 DIAGNOSIS — K648 Other hemorrhoids: Secondary | ICD-10-CM | POA: Insufficient documentation

## 2015-10-28 DIAGNOSIS — Z79899 Other long term (current) drug therapy: Secondary | ICD-10-CM | POA: Insufficient documentation

## 2015-10-28 DIAGNOSIS — Z8042 Family history of malignant neoplasm of prostate: Secondary | ICD-10-CM | POA: Insufficient documentation

## 2015-10-28 DIAGNOSIS — K649 Unspecified hemorrhoids: Secondary | ICD-10-CM | POA: Insufficient documentation

## 2015-10-28 DIAGNOSIS — K5730 Diverticulosis of large intestine without perforation or abscess without bleeding: Secondary | ICD-10-CM | POA: Diagnosis not present

## 2015-10-28 DIAGNOSIS — Z800 Family history of malignant neoplasm of digestive organs: Secondary | ICD-10-CM | POA: Diagnosis not present

## 2015-10-28 DIAGNOSIS — I10 Essential (primary) hypertension: Secondary | ICD-10-CM | POA: Insufficient documentation

## 2015-10-28 DIAGNOSIS — Q438 Other specified congenital malformations of intestine: Secondary | ICD-10-CM | POA: Diagnosis not present

## 2015-10-28 DIAGNOSIS — Z1211 Encounter for screening for malignant neoplasm of colon: Secondary | ICD-10-CM | POA: Insufficient documentation

## 2015-10-28 HISTORY — PX: COLONOSCOPY: SHX5424

## 2015-10-28 SURGERY — COLONOSCOPY
Anesthesia: Moderate Sedation

## 2015-10-28 MED ORDER — MEPERIDINE HCL 100 MG/ML IJ SOLN
INTRAMUSCULAR | Status: AC
  Filled 2015-10-28: qty 2

## 2015-10-28 MED ORDER — MIDAZOLAM HCL 5 MG/5ML IJ SOLN
INTRAMUSCULAR | Status: AC
  Filled 2015-10-28: qty 10

## 2015-10-28 MED ORDER — STERILE WATER FOR IRRIGATION IR SOLN
Status: DC | PRN
  Administered 2015-10-28: 09:00:00

## 2015-10-28 MED ORDER — SODIUM CHLORIDE 0.9 % IV SOLN
INTRAVENOUS | Status: DC
  Administered 2015-10-28: 1000 mL via INTRAVENOUS

## 2015-10-28 MED ORDER — MIDAZOLAM HCL 5 MG/5ML IJ SOLN
INTRAMUSCULAR | Status: DC | PRN
  Administered 2015-10-28 (×3): 2 mg via INTRAVENOUS

## 2015-10-28 MED ORDER — MEPERIDINE HCL 100 MG/ML IJ SOLN
INTRAMUSCULAR | Status: DC | PRN
  Administered 2015-10-28 (×3): 25 mg

## 2015-10-28 NOTE — Discharge Instructions (Signed)
You DID NOT HAVE ANY POLYPS. YOU HAVE DIVERTICULOSIS IN YOUR LEFT COLON. You have internal hemorrhoids.   DRINK WATER TO KEEP URINE LIGHT YELLOW.  FOLLOW A HIGH FIBER DIET. AVOID ITEMS THAT CAUSE BLOATING & GAS. SEE INFO BELOW.  CONTINUE YOUR WEIGHT LOSS EFFORTS. YOUR BODY MASS INDEX IS OVER 40 WHICH MEANS YOU ARE MORBIDLY OBESE. THIS SHORTENS YOUR LIFE EXPECTANCY 10 YEARS AND IT PUTS YOU AT INCREASED RISK FOR ALL CANCERS, INCLUDING ESOPHAGEAL AND COLON CANCER AND COMPLICATIONS FROM MEDICAL PROCEDURES.   Next colonoscopy in 5 years.   Colonoscopy Care After Read the instructions outlined below and refer to this sheet in the next week. These discharge instructions provide you with general information on caring for yourself after you leave the hospital. While your treatment has been planned according to the most current medical practices available, unavoidable complications occasionally occur. If you have any problems or questions after discharge, call DR. Rosemond Lyttle, [phone].  ACTIVITY  You may resume your regular activity, but move at a slower pace for the next 24 hours.   Take frequent rest periods for the next 24 hours.   Walking will help get rid of the air and reduce the bloated feeling in your belly (abdomen).   No driving for 24 hours (because of the medicine (anesthesia) used during the test).   You may shower.   Do not sign any important legal documents or operate any machinery for 24 hours (because of the anesthesia used during the test).    NUTRITION  Drink plenty of fluids.   You may resume your normal diet as instructed by your doctor.   Begin with a light meal and progress to your normal diet. Heavy or fried foods are harder to digest and may make you feel sick to your stomach (nauseated).   Avoid alcoholic beverages for 24 hours or as instructed.    MEDICATIONS  You may resume your normal medications.   WHAT YOU CAN EXPECT TODAY  Some feelings of  bloating in the abdomen.   Passage of more gas than usual.   Spotting of blood in your stool or on the toilet paper  .  IF YOU HAD POLYPS REMOVED DURING THE COLONOSCOPY:  Eat a soft diet IF YOU HAVE NAUSEA, BLOATING, ABDOMINAL PAIN, OR VOMITING.    FINDING OUT THE RESULTS OF YOUR TEST Not all test results are available during your visit. DR. Oneida Alar WILL CALL YOU WITHIN 7 DAYS OF YOUR PROCEDUE WITH YOUR RESULTS. Do not assume everything is normal if you have not heard from DR. Palmer Fahrner IN ONE WEEK, CALL HER OFFICE AT [phone].  SEEK IMMEDIATE MEDICAL ATTENTION AND CALL THE OFFICE: [phone] IF:  You have more than a spotting of blood in your stool.   Your belly is swollen (abdominal distention).   You are nauseated or vomiting.   You have a temperature over 101F.   You have abdominal pain or discomfort that is severe or gets worse throughout the day.   Constipation in Adults Constipation is having fewer than 2 bowel movements per week. Usually, the stools are hard. As we grow older, constipation is more common. If you try to fix constipation with laxatives, the problem may get worse. This is because laxatives taken over a long period of time make the colon muscles weaker. A low-fiber diet, not taking in enough fluids, and taking some medicines may make these problems worse.  HOME CARE INSTRUCTIONS  Constipation is usually best cared for without medicines. Increasing  dietary fiber and eating more fruits and vegetables is the best way to manage constipation.   Slowly increase fiber intake to 25 to 38 grams per day. Whole grains, fruits, vegetables, and legumes are good sources of fiber. A dietitian can further help you incorporate high-fiber foods into your diet.   Drink enough water and fluids to keep your urine clear or pale yellow.   A fiber supplement may be added to your diet if you cannot get enough fiber from foods.   Increasing your activities also helps improve  regularity.   Stronger measures, such as magnesium sulfate, should be avoided if possible. This may cause uncontrollable diarrhea. Using magnesium sulfate may not allow you time to make it to the bathroom.    High-Fiber Diet A high-fiber diet changes your normal diet to include more whole grains, legumes, fruits, and vegetables. Changes in the diet involve replacing refined carbohydrates with unrefined foods. The calorie level of the diet is essentially unchanged. The Dietary Reference Intake (recommended amount) for adult males is 38 grams per day. For adult females, it is 25 grams per day. Pregnant and lactating women should consume 28 grams of fiber per day. Fiber is the intact part of a plant that is not broken down during digestion. Functional fiber is fiber that has been isolated from the plant to provide a beneficial effect in the body. PURPOSE  Increase stool bulk.   Ease and regulate bowel movements.   Lower cholesterol.  REDUCE RISK OF COLON CANCER  INDICATIONS THAT YOU NEED MORE FIBER  Constipation and hemorrhoids.   Uncomplicated diverticulosis (intestine condition) and irritable bowel syndrome.   Weight management.   As a protective measure against hardening of the arteries (atherosclerosis), diabetes, and cancer.   GUIDELINES FOR INCREASING FIBER IN THE DIET  Start adding fiber to the diet slowly. A gradual increase of about 5 more grams (2 slices of whole-wheat bread, 2 servings of most fruits or vegetables, or 1 bowl of high-fiber cereal) per day is best. Too rapid an increase in fiber may result in constipation, flatulence, and bloating.   Drink enough water and fluids to keep your urine clear or pale yellow. Water, juice, or caffeine-free drinks are recommended. Not drinking enough fluid may cause constipation.   Eat a variety of high-fiber foods rather than one type of fiber.   Try to increase your intake of fiber through using high-fiber foods rather than fiber  pills or supplements that contain small amounts of fiber.   The goal is to change the types of food eaten. Do not supplement your present diet with high-fiber foods, but replace foods in your present diet.   INCLUDE A VARIETY OF FIBER SOURCES  Replace refined and processed grains with whole grains, canned fruits with fresh fruits, and incorporate other fiber sources. White rice, white breads, and most bakery goods contain little or no fiber.   Brown whole-grain rice, buckwheat oats, and many fruits and vegetables are all good sources of fiber. These include: broccoli, Brussels sprouts, cabbage, cauliflower, beets, sweet potatoes, white potatoes (skin on), carrots, tomatoes, eggplant, squash, berries, fresh fruits, and dried fruits.   Cereals appear to be the richest source of fiber. Cereal fiber is found in whole grains and bran. Bran is the fiber-rich outer coat of cereal grain, which is largely removed in refining. In whole-grain cereals, the bran remains. In breakfast cereals, the largest amount of fiber is found in those with "bran" in their names. The fiber content is  sometimes indicated on the label.   You may need to include additional fruits and vegetables each day.   In baking, for 1 cup white flour, you may use the following substitutions:   1 cup whole-wheat flour minus 2 tablespoons.   1/2 cup white flour plus 1/2 cup whole-wheat flour.   Diverticulosis Diverticulosis is a common condition that develops when small pouches (diverticula) form in the wall of the colon. The risk of diverticulosis increases with age. It happens more often in people who eat a low-fiber diet. Most individuals with diverticulosis have no symptoms. Those individuals with symptoms usually experience belly (abdominal) pain, constipation, or loose stools (diarrhea).  HOME CARE INSTRUCTIONS  Increase the amount of fiber in your diet as directed by your caregiver or dietician. This may reduce symptoms of  diverticulosis.   Drink at least 6 to 8 glasses of water each day to prevent constipation.   Try not to strain when you have a bowel movement.   THERE IS NO NEED TO Avoid nuts and seeds to prevent complications.   FOODS HAVING HIGH FIBER CONTENT INCLUDE:  Fruits. Apple, peach, pear, tangerine, raisins, prunes.   Vegetables. Brussels sprouts, asparagus, broccoli, cabbage, carrot, cauliflower, romaine lettuce, spinach, summer squash, tomato, winter squash, zucchini.   Starchy Vegetables. Baked beans, kidney beans, lima beans, split peas, lentils, potatoes (with skin).   Grains. Whole wheat bread, brown rice, bran flake cereal, plain oatmeal, white rice, shredded wheat, bran muffins.    Hemorrhoids Hemorrhoids are dilated (enlarged) veins around the rectum. Sometimes clots will form in the veins. This makes them swollen and painful. These are called thrombosed hemorrhoids. Causes of hemorrhoids include:  Constipation.   Straining to have a bowel movement.   HEAVY LIFTING  HOME CARE INSTRUCTIONS  Eat a well balanced diet and drink 6 to 8 glasses of water every day to avoid constipation. You may also use a bulk laxative.   Avoid straining to have bowel movements.   Keep anal area dry and clean.   Do not use a donut shaped pillow or sit on the toilet for long periods. This increases blood pooling and pain.   Move your bowels when your body has the urge; this will require less straining and will decrease pain and pressure.

## 2015-10-28 NOTE — Op Note (Signed)
[facility] Patient Name: Christina Arroyo Procedure Date: 10/28/2015 8:17 AM MRN: [id number] Date of Birth: 05-Aug-1970 Attending MD: Barney Drain , MD CSN: [id number] Age: 45 Admit Type: Outpatient Procedure:                Colonoscopy, SCREENING Indications:              Screening in patient at increased risk: Family                            history of 1st-degree relative with colorectal                            cancer before age 29 years Providers:                Barney Drain, MD, Janeece Riggers, RN, Rosina Lowenstein, RN Referring MD:             Lonie Peak. Dixon Medicines:                Meperidine 75 mg IV, Midazolam 6 mg IV Complications:            No immediate complications. Estimated Blood Loss:     Estimated blood loss: none. Procedure:                Pre-Anesthesia Assessment:                           - Prior to the procedure, a History and Physical                            was performed, and patient medications and                            allergies were reviewed. The patient's tolerance of                            previous anesthesia was also reviewed. The risks                            and benefits of the procedure and the sedation                            options and risks were discussed with the patient.                            All questions were answered, and informed consent                            was obtained. Prior Anticoagulants: The patient has                            taken no previous anticoagulant or antiplatelet                            agents. ASA Grade Assessment: II - A patient with  mild systemic disease. After reviewing the risks                            and benefits, the patient was deemed in                            satisfactory condition to undergo the procedure.                           After obtaining informed consent, the colonoscope                            was passed under direct vision. Throughout  the                            procedure, the patient's blood pressure, pulse, and                            oxygen saturations were monitored continuously. The                            EC-[id number]) scope was introduced through                            the anus and advanced to the the cecum, identified                            by appendiceal orifice and ileocecal valve. The                            colonoscopy was somewhat difficult due to a                            tortuous colon. Successful completion of the                            procedure was aided by straightening and shortening                            the scope to obtain bowel loop reduction. The                            patient tolerated the procedure well. The quality                            of the bowel preparation was excellent. The                            ileocecal valve, appendiceal orifice, and rectum                            were photographed. Scope In: 8:57:41 AM Scope Out: 9:11:09 AM Scope Withdrawal Time: 0 hours 9 minutes 34 seconds  Total Procedure Duration: 0 hours  13 minutes 28 seconds  Findings:      The digital rectal exam findings include non-thrombosed external       hemorrhoids.      The recto-sigmoid colon was mildly redundant. Advancing the scope       required straightening and shortening the scope to obtain bowel loop       reduction.      A few small-mouthed diverticula were found in the sigmoid colon.      Non-bleeding external and internal hemorrhoids were found. The       hemorrhoids were moderate. Impression:               - SLIGHTLY Redundant colon.                           - MILD Diverticulosis in the sigmoid colon.                           - Non-bleeding external and internal hemorrhoids. Moderate Sedation:      Moderate (conscious) sedation was administered by the endoscopy nurse       and supervised by the endoscopist. The following parameters were        monitored: oxygen saturation, heart rate, blood pressure, and response       to care. Total physician intraservice time was 29 minutes. Recommendation:           - High fiber diet.                           - Continue present medications.                           - Repeat colonoscopy in 5 years for surveillance.                           - Patient has a contact number available for                            emergencies. The signs and symptoms of potential                            delayed complications were discussed with the                            patient. Return to normal activities tomorrow.                            Written discharge instructions were provided to the                            patient. Procedure Code(s):        --- Professional ---                           [id number], Colorectal cancer screening; colonoscopy on                            individual at high risk  99153, Moderate sedation services; each additional                            15 minutes intraservice time                           G0500, Moderate sedation services provided by the                            same physician or other qualified health care                            professional performing a gastrointestinal                            endoscopic service that sedation supports,                            requiring the presence of an independent trained                            observer to assist in the monitoring of the                            patient's level of consciousness and physiological                            status; initial 15 minutes of intra-service time;                            patient age 66 years or older (additional time may                            be reported with [phone], as appropriate) Diagnosis Code(s):        --- Professional ---                           Z80.0, Family history of malignant neoplasm of                             digestive organs                           K64.4, Residual hemorrhoidal skin tags                           K64.8, Other hemorrhoids                           K57.30, Diverticulosis of large intestine without                            perforation or abscess without bleeding                           Q43.8, Other specified  congenital malformations of                            intestine CPT copyright 2016 American [employer] Association. All rights reserved. The codes documented in this report are preliminary and upon coder review may  be revised to meet current compliance requirements. Barney Drain, MD Barney Drain, MD 10/28/2015 9:41:53 AM This report has been signed electronically. Number of Addenda: 0

## 2015-10-28 NOTE — H&P (Signed)
  Primary Care Physician:  [name], PA-C Primary Gastroenterologist:  Dr. Oneida Alar  Pre-Procedure History & Physical: HPI:  Christina Arroyo is a 45 y.o. female here for [facility].  Past Medical History  Diagnosis Date  . Gall stones   . Hypertension     no has diagnosed at HTN    Past Surgical History  Procedure Laterality Date  . Cholecystectomy N/A 08/23/2013    Procedure: LAPAROSCOPIC CHOLECYSTECTOMY;  Surgeon: Jamesetta So, MD;  Location: [facility];  Service: General;  Laterality: N/A;  . Tubal ligation  08/1997    Prior to Admission medications   Medication Sig Start Date End Date Taking? Authorizing Provider  Cholecalciferol 4000 units CAPS Take 1 capsule by mouth daily.   Yes Historical Provider, MD  lisinopril (PRINIVIL,ZESTRIL) 20 MG tablet Take 1 tablet (20 mg total) by mouth daily. 05/29/15  Yes Mary B Dixon, PA-C  polyethylene glycol-electrolytes (TRILYTE) 420 g solution Take 4,000 mLs by mouth as directed. 10/15/15  Yes Danie Binder, MD  linaclotide (LINZESS) 72 MCG capsule Take 1 capsule (72 mcg total) by mouth daily before breakfast. Patient taking differently: Take 72 mcg by mouth daily as needed (constipation).  09/19/15   Lonie Peak Dixon, PA-C  nitrofurantoin, macrocrystal-monohydrate, (MACROBID) 100 MG capsule Take 1 capsule (100 mg total) by mouth 2 (two) times daily. Patient not taking: Reported on 09/30/2015 09/19/15   Orlena Sheldon, PA-C    Allergies as of 10/02/2015  . (No Known Allergies)    Family History  Problem Relation Age of Onset  . Hypertension Father   . Cancer Father 64    colon and prostrate  . Pulmonary embolism Mother   . Cancer Paternal Grandfather     Social History   Social History  . Marital Status: Married    Spouse Name: N/A  . Number of Children: N/A  . Years of Education: N/A   Occupational History  . Not on file.   Social History Main Topics  . Smoking status: Never Smoker   . Smokeless tobacco: Never Used  .  Alcohol Use: No  . Drug Use: No  . Sexual Activity: Yes    Birth Control/ Protection: Surgical   Other Topics Concern  . Not on file   Social History Narrative   Entered 11/2014:    Married. 3 children--Ages 17,19,20 y/o.   ----79 y/o still at home--should graduate HS upcoming year.   --Other 2 are in colleg--1 at [employer]. 1 at [employer].       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         Review of Systems: See HPI, otherwise negative ROS   Physical Exam: BP 126/84 mmHg  Pulse 99  Temp(Src) 97.9 F (36.6 C) (Oral)  Resp 19  Ht 5' (1.524 m)  Wt 225 lb (102.059 kg)  BMI 43.94 kg/m2  SpO2 98%  LMP 10/20/2015 General:   Alert,  pleasant and cooperative in NAD Head:  Normocephalic and atraumatic. Neck:  Supple; Lungs:  Clear throughout to auscultation.    Heart:  Regular rate and rhythm. Abdomen:  Soft, nontender and nondistended. Normal bowel sounds, without guarding, and without rebound.   Neurologic:  Alert and  oriented x4;  grossly normal neurologically.  Impression/Plan:     SCREENING  Plan:  1. TCS TODAY

## 2015-10-29 ENCOUNTER — Encounter (HOSPITAL_COMMUNITY): Payer: Self-pay | Admitting: Gastroenterology

## 2016-01-03 ENCOUNTER — Other Ambulatory Visit: Payer: Self-pay | Admitting: Physician Assistant

## 2016-01-03 DIAGNOSIS — I10 Essential (primary) hypertension: Secondary | ICD-10-CM

## 2016-01-06 NOTE — Telephone Encounter (Signed)
Medication refilled per protocol. 

## 2016-03-30 ENCOUNTER — Ambulatory Visit (INDEPENDENT_AMBULATORY_CARE_PROVIDER_SITE_OTHER): Payer: BLUE CROSS/BLUE SHIELD | Admitting: Physician Assistant

## 2016-03-30 ENCOUNTER — Encounter: Payer: Self-pay | Admitting: Physician Assistant

## 2016-03-30 VITALS — BP 130/82 | HR 81 | Temp 98.1°F | Resp 16 | Wt 214.0 lb

## 2016-03-30 DIAGNOSIS — Z800 Family history of malignant neoplasm of digestive organs: Secondary | ICD-10-CM | POA: Diagnosis not present

## 2016-03-30 DIAGNOSIS — D259 Leiomyoma of uterus, unspecified: Secondary | ICD-10-CM | POA: Diagnosis not present

## 2016-03-30 DIAGNOSIS — E559 Vitamin D deficiency, unspecified: Secondary | ICD-10-CM

## 2016-03-30 DIAGNOSIS — I10 Essential (primary) hypertension: Secondary | ICD-10-CM

## 2016-03-30 DIAGNOSIS — K5909 Other constipation: Secondary | ICD-10-CM | POA: Diagnosis not present

## 2016-03-30 LAB — BASIC METABOLIC PANEL WITH GFR
BUN: 13 mg/dL (ref 7–25)
CO2: 27 mmol/L (ref 20–31)
Calcium: 9.5 mg/dL (ref 8.6–10.2)
Chloride: 105 mmol/L (ref 98–110)
Creat: 0.73 mg/dL (ref 0.50–1.10)
GFR, Est African American: >89 mL/min (ref >=60)
GFR, Est Non African American: >89 mL/min (ref >=60)
Glucose, Bld: 103 mg/dL — ABNORMAL HIGH (ref 70–99)
Potassium: 4.7 mmol/L (ref 3.5–5.3)
Sodium: 138 mmol/L (ref 135–146)

## 2016-03-30 NOTE — Progress Notes (Signed)
Patient ID: Christina Arroyo MRN: [id number], DOB: 11-19-1970, 45 y.o. Date of Encounter: 03/30/2016,   Chief Complaint: F/U Hypertension  HPI: 45 y.o. y/o AA female  here for above.  11/22/2014:  She is here for CPE and also being seen as a new patient to establish care with [place].  She is married with 3 children. They are 17,19, and 20. She says that the 45 year old is still at home but should graduate from high school this upcoming year. The other 2 are in college--one is at [employer] and one is at [employer]. 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 She says that she is off on Thursdays.  She says that she has lived in [place] "all her life". Says that her employer is now providing her insurance. Says in the past she was going to the [facility]. Says that she last had a "complete physical "with them 08/2013. Says that at that time they did a Pap Smear, which was normal. Also says that she had a mammogram performed at [facility] in either 08/2013 or 09/2013, which was normal.  Says that the main thing she was concerned about addressing today-- is that she checks her blood pressure and gets high readings of around 140/90 or around 140s over 100.  At that visit-- I prescribed lisinopril 10 mg daily. She was to return for labs and blood pressure check 1 week later. She did return for lab work but I do not see that she return to check blood pressure here.   05/15/2015: She reports that she has been taking the lisinopril 10 mg daily. He reports that she has been checking her blood pressure at home and is getting similar readings as what we are getting today which is 132/94. He says that the diastolic is always in the 90s when she checks it at home. Has no other complaints or concerns today. At that OV--Increased Lisinopril to 20mg  QD  05/29/2015: She reports she is taking the Lisinopril 20mg  QD. No adverse effects. Has checked BP  once at home--got 118/84.  Reviewed that at lab 11/2014--Vitamin D was very low at 9. Told to take Ergocalciferol 50,000 units once a week for 12 weeks then otc Vit D 4,000 Units QD---Pt says this is what she did--and is taking otc 4,000 QD currently.  09/19/2015:  She reports that she has felt some dysuria recently. No fever/chills. No back pain. She is taking the lisinopril 20 mg daily. Still checks her blood pressure at home and getting good readings. She is still taking the vitamin D 4000 units daily. Reviewed that at prior OV she reported that she was taking Linzess just 2-3 times per week b/c it was causing diarrhea o/w. At today's Ov, discussed that they now have a lower dose available--changed to the 77 mcg dose.  Says her position changed at work and no longer has a day off during the week.   03/30/2016: She says it was wondering she went to discuss with me. She says that on MRI in the past it coincidently showed a uterine fibroid. Says that recently her menses have been coming more frequently and she is having heavier bleeding. She is wondering if she needs to see a gynecologist. She saw Dr. Glo Herring in the past but says it was about 15 years ago. She has no other specific complaints or concerns today. She is taking her blood pressure medicine as  directed. No lightheadedness or other adverse effects. At last visit I decreased the Linzess dose down to the 77 dose. She says that even with this low dose she does not have to take it every day. Takes it 2-3 times a week as needed. She is still taking vitamin D supplement 4000 units daily. She had her colonoscopy in June and was told to repeat 5 years secondary to her family history. She is aware that she needs to schedule mammogram and has not had the one for 2017 yet.   Review of Systems: Consitutional: No fever, chills, fatigue, night sweats, lymphadenopathy. No significant/unexplained weight changes. Eyes: No visual changes, eye redness,  or discharge. ENT/Mouth: No ear pain, sore throat, nasal drainage, or sinus pain. Cardiovascular: No chest pressure,heaviness, tightness or squeezing, even with exertion. No increased shortness of breath or dyspnea on exertion.No palpitations, edema, orthopnea, PND. Respiratory: No cough, hemoptysis, SOB, or wheezing. Gastrointestinal: No anorexia, dysphagia, reflux, pain, nausea, vomiting, hematemesis, diarrhea,  BRBPR, or melena. Breast: No mass, nodules, bulging, or retraction. No skin changes or inflammation. No nipple discharge. No lymphadenopathy. Genitourinary: No  hematuria, incontinence, vaginal discharge, pruritis, burning, abnormal bleeding, or pain. Musculoskeletal: No decreased ROM, No joint pain or swelling. No significant pain in neck, back, or extremities. Skin: No rash, pruritis, or concerning lesions. Neurological: No headache, dizziness, syncope, seizures, tremors, memory loss, coordination problems, or paresthesias. Psychological: No anxiety, depression, hallucinations, SI/HI. Endocrine: No polydipsia, polyphagia, polyuria, or known diabetes.No increased fatigue. No palpitations/rapid heart rate. No significant/unexplained weight change. All other systems were reviewed and are otherwise negative.  Past Medical History:  Diagnosis Date  . Gall stones   . Hypertension      Past Surgical History:  Procedure Laterality Date  . CHOLECYSTECTOMY N/A 08/23/2013   Procedure: LAPAROSCOPIC CHOLECYSTECTOMY;  Surgeon: Jamesetta So, MD;  Location: [facility];  Service: General;  Laterality: N/A;  . COLONOSCOPY N/A 10/28/2015   Procedure: COLONOSCOPY;  Surgeon: Danie Binder, MD;  Location: [facility];  Service: Endoscopy;  Laterality: N/A;  8:30 Am  . TUBAL LIGATION  08/1997    Home Meds:  Outpatient Medications Prior to Visit  Medication Sig Dispense Refill  . Cholecalciferol 4000 units CAPS Take 1 capsule by mouth daily.    Marland Kitchen linaclotide (LINZESS) 72 MCG capsule Take 1 capsule  (72 mcg total) by mouth daily before breakfast. (Patient taking differently: Take 72 mcg by mouth daily as needed (constipation). ) 90 capsule 2  . lisinopril (PRINIVIL,ZESTRIL) 20 MG tablet TAKE ONE TABLET BY MOUTH DAILY. 90 tablet 0   No facility-administered medications prior to visit.     Allergies: No Known Allergies  Social History   Social History  . Marital status: Married    Spouse name: N/A  . Number of children: N/A  . Years of education: N/A   Occupational History  . Not on file.   Social History Main Topics  . Smoking status: Never Smoker  . Smokeless tobacco: Never Used  . Alcohol use No  . Drug use: No  . Sexual activity: Yes    Birth control/ protection: Surgical   Other Topics Concern  . Not on file   Social History Narrative   Entered 11/2014:    Married. 3 children--Ages 17,19,20 y/o.   ----10 y/o still at home--should graduate HS upcoming year.   --Other 2 are in colleg--1 at [employer]. 1 at [employer].       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         Family History  Problem Relation Age of Onset  . Hypertension Father   . Cancer Father 50    colon? and prostate  . Pulmonary embolism Mother   . Cancer Paternal Grandfather     Physical Exam: Blood pressure 130/82, pulse 81, temperature 98.1 F (36.7 C), resp. rate 16, weight 214 lb (97.1 kg), last menstrual period 03/26/2016, SpO2 99 %., Body mass index is 41.79 kg/m. General: Obese AAF. Appears in no acute distress. Neck: Supple. Trachea midline. No thyromegaly. Full ROM. No lymphadenopathy.No Carotid Bruits. Lungs: Clear to auscultation bilaterally without wheezes, rales, or rhonchi. Breathing is of normal effort and unlabored. Cardiovascular: RRR with S1 S2. No murmurs, rubs, or gallops.  No carotid  bruits. Musculoskeletal: Full range of motion and 5/5 strength throughout. Skin: Warm and moist without erythema, ecchymosis, wounds, or  rash. Neuro: A+Ox3. CN II-XII grossly intact. Moves all extremities spontaneously.  Normal gait. Psych:  Responds to questions appropriately with a normal affect.   Assessment/Plan:  45 y.o. y/o female here for     Essential hypertension 03/30/2016: BP now at goal/controlled. Cont Lisinopril 20mg  QD. Check lab to monitor.   Chronic constipation She states that she does not have to take the Linzess every single day but does use it about 2-3 times per week, on average. At OV 09/19/2015--changed dose to Linzess 15mcg daily [facility] 03/30/2016--she reports even with this lower dose she still doesn't have to take it every single day but does use it 2-3 times a week - linaclotide (LINZESS) 72 MCG capsule; Take 1 capsule (72 mcg total) by mouth daily before breakfast.  Dispense: 90 capsule; Refill: 2  Vitamin D deficiency Reviewed that at lab 11/2014--Vitamin D was very low at 9. Told to take Ergocalciferol 50,000 units once a week for 12 weeks then otc Vit D 4,000 Units QD- -At [place] 05/29/2015: -Pt says this is what she did--and is taking otc 4,000 QD currently. At [place] 05/29/2015---Recheck Vit D level 03/30/2016: reviewed that I check vitamin D level on lab 09/2015. At that time vitamin D level was good at 50. She is to continue current dose of 4000 units daily.  Family history of colon cancer She had colonoscopy 10/2015. Repeat 5 years.  5. Uterine leiomyoma, unspecified location 03/30/2016: Will refer her back to Dr. Glo Herring. - Ambulatory referral to Gynecology   Routine f/u OV 6 months, sooner if needed.     THE FOLLOWING IS COPIED FROM HER CPE 11/22/2014:  Visit for preventive health examination  A. Screening Labs: She is not fasting today but says that she is off on Thursdays and can return this upcoming Thursday fasting for lab work. - CBC with Differential/Platelet; Future-----------------------------------------Normal - COMPLETE METABOLIC PANEL WITH GFR;  Future------------------Normal - Lipid panel; Future--------------------------------------------------------------------Excellent--Trig-60, HDL- 71,  LDL- 110 - TSH; Future-------------------------------------------------------------------------Normal - Vit D  25 hydroxy (rtn osteoporosis monitoring); Future-------------------9---See Above   B. Pap: She states that she had Pap smear performed 08/2013 and was normal. Pelvic exam performed today but can wait to repeat Pap smear.  C. Screening Mammogram: She states that she had mammogram performed at [facility] in either April or May 2015 and was negative. She is agreeable for me to schedule her for follow-up mammogram. - MM Digital Screening; Future She did have mammogram 12/17/2014  D. DEXA/BMD:  This does not need to performed until around age 39.  E. Colorectal Cancer Screening:  Her father was diagnosed with colon cancer at age 49. Patient is currently age 41. Therefore we'll go ahead and refer her to GI to see if she needs to go ahead and start screening colonoscopy, as she is 10 years less than his age at diagnosis. Discussed this with her today and she is agreeable - Ambulatory referral to Gastroenterology At [place] 09/19/2015--discussed that she never went to appt. Work schedule changed. Has no day off during week. Agreeable for me to order Referral again --added to schedule at end of day around 4pm.   F. Immunizations:  Influenza:---N/A--July. Follow-up visit 05/15/15 discussed flu vaccine but she defers. Tetanus:--- She has had no tetanus in > 10 years. Agreeable to update today.  Tdap--given here 11/22/2014 Pneumococcal:  She has no indication to require a pneumonia vaccine until age 36. Zostavax: Not indicated until age 32.   Family history of colon cancer See #1-E Above.  - Ambulatory referral to Gastroenterology   Signed, Olean Ree Jemison, [place], [facility] 03/30/2016 8:22 AM

## 2016-04-13 ENCOUNTER — Ambulatory Visit (INDEPENDENT_AMBULATORY_CARE_PROVIDER_SITE_OTHER): Payer: BLUE CROSS/BLUE SHIELD | Admitting: Obstetrics and Gynecology

## 2016-04-13 ENCOUNTER — Encounter: Payer: Self-pay | Admitting: Obstetrics and Gynecology

## 2016-04-13 VITALS — BP 130/88 | HR 86 | Wt 213.4 lb

## 2016-04-13 DIAGNOSIS — R1030 Lower abdominal pain, unspecified: Secondary | ICD-10-CM | POA: Diagnosis not present

## 2016-04-13 DIAGNOSIS — N920 Excessive and frequent menstruation with regular cycle: Secondary | ICD-10-CM | POA: Diagnosis not present

## 2016-04-13 DIAGNOSIS — N938 Other specified abnormal uterine and vaginal bleeding: Secondary | ICD-10-CM | POA: Diagnosis not present

## 2016-04-13 DIAGNOSIS — D250 Submucous leiomyoma of uterus: Secondary | ICD-10-CM

## 2016-04-13 DIAGNOSIS — D251 Intramural leiomyoma of uterus: Principal | ICD-10-CM

## 2016-04-13 NOTE — Progress Notes (Signed)
[facility] Visit  04/13/16           Patient name: Christina Arroyo MRN [id number]  Date of birth: 09/15/1970  CC & HPI:  Christina Arroyo is a 45 y.o. female presenting today for more frequent periods and heavier bleeding. She states most recently her period returned after 18 days and lasts for 7-8 days. She states she had an MRI in the past which showed uterine fibroids but states her PCP has not evaluated her for this. She reports abdominal pressure that occurs a couple of days after the end of her period and reports having days of spotting between periods. She states she has to wake to void ~3-4 times at night.    ROS:  ROS +menorrhagia  +frequent periods +abdominal pressure  Pertinent History Reviewed:   Reviewed: Significant for tubal ligation, cholecystectomy  Medical         Past Medical History:  Diagnosis Date  . Gall stones   . Hypertension                               Surgical Hx:    Past Surgical History:  Procedure Laterality Date  . CHOLECYSTECTOMY N/A 08/23/2013   Procedure: LAPAROSCOPIC CHOLECYSTECTOMY;  Surgeon: Jamesetta So, MD;  Location: [facility];  Service: General;  Laterality: N/A;  . COLONOSCOPY N/A 10/28/2015   Procedure: COLONOSCOPY;  Surgeon: Danie Binder, MD;  Location: [facility];  Service: Endoscopy;  Laterality: N/A;  8:30 Am  . TUBAL LIGATION  08/1997   Medications: Reviewed & Updated - see associated section                       Current Outpatient Prescriptions:  .  Cholecalciferol 4000 units CAPS, Take 1 capsule by mouth daily., Disp: , Rfl:  .  linaclotide (LINZESS) 72 MCG capsule, Take 1 capsule (72 mcg total) by mouth daily before breakfast. (Patient taking differently: Take 72 mcg by mouth daily as needed (constipation). ), Disp: 90 capsule, Rfl: 2 .  lisinopril (PRINIVIL,ZESTRIL) 20 MG tablet, TAKE ONE TABLET BY MOUTH DAILY., Disp: 90 tablet, Rfl: 0   Social History: Reviewed -  reports that she has never smoked. She has never used  smokeless tobacco.  Objective Findings:  Vitals: Blood pressure 130/88, pulse 86, weight 213 lb 6.4 oz (96.8 kg), last menstrual period 03/26/2016.  Physical Examination: General appearance - alert, well appearing, and in no distress and oriented to person, place, and time Abdomen - thick abdominal wall, consistent with BMI Pelvic -  VULVA: normal appearing vulva with no masses, tenderness or lesions,  VAGINA: normal appearing vagina with normal color and discharge, no lesions, good support CERVIX: normal appearing cervix without discharge or lesions,  UERUS:  Good support, 14 wk size, diffusely enlarged  ADNEXA: normal adnexa in size, nontender and no masses  Discussion: Discussed different options for uterine fibroid treatments.   At end of discussion, pt had opportunity to ask questions and has no further questions at this time.   Specific discussion of IUD vs surgery as noted above. Greater than 50% was spent in counseling and coordination of care with the patient.   Total time greater than: 15 minutes.    Assessment & Plan:   A:  1. Uterine fibroids  P:  1. Pelvic US and further discussion of treatment options: IUD vs OCP vs surgery (hysterectomy)  By signing my name below, I, Sonum Patel, attest that this documentation has been prepared under the direction and in the presence of Jonnie Kind, MD. Electronically Signed: Sonum Patel, Education administrator. 04/13/16. 9:17 AM.  I personally performed the services described in this documentation, which was SCRIBED in my presence. The recorded information has been reviewed and considered accurate. It has been edited as necessary during review. Jonnie Kind, MD

## 2016-04-16 ENCOUNTER — Other Ambulatory Visit: Payer: Self-pay | Admitting: Physician Assistant

## 2016-04-16 DIAGNOSIS — I10 Essential (primary) hypertension: Secondary | ICD-10-CM

## 2016-04-17 ENCOUNTER — Other Ambulatory Visit: Payer: Self-pay | Admitting: Obstetrics and Gynecology

## 2016-04-17 DIAGNOSIS — D251 Intramural leiomyoma of uterus: Principal | ICD-10-CM

## 2016-04-17 DIAGNOSIS — D250 Submucous leiomyoma of uterus: Secondary | ICD-10-CM

## 2016-04-20 ENCOUNTER — Ambulatory Visit (INDEPENDENT_AMBULATORY_CARE_PROVIDER_SITE_OTHER): Payer: BLUE CROSS/BLUE SHIELD

## 2016-04-20 ENCOUNTER — Ambulatory Visit (INDEPENDENT_AMBULATORY_CARE_PROVIDER_SITE_OTHER): Payer: BLUE CROSS/BLUE SHIELD | Admitting: Obstetrics and Gynecology

## 2016-04-20 ENCOUNTER — Encounter (INDEPENDENT_AMBULATORY_CARE_PROVIDER_SITE_OTHER): Payer: Self-pay

## 2016-04-20 ENCOUNTER — Other Ambulatory Visit: Payer: BLUE CROSS/BLUE SHIELD

## 2016-04-20 ENCOUNTER — Encounter: Payer: Self-pay | Admitting: Obstetrics and Gynecology

## 2016-04-20 VITALS — BP 124/72 | HR 86 | Ht 60.0 in | Wt 213.6 lb

## 2016-04-20 DIAGNOSIS — D251 Intramural leiomyoma of uterus: Secondary | ICD-10-CM | POA: Diagnosis not present

## 2016-04-20 DIAGNOSIS — N854 Malposition of uterus: Secondary | ICD-10-CM | POA: Diagnosis not present

## 2016-04-20 DIAGNOSIS — N920 Excessive and frequent menstruation with regular cycle: Secondary | ICD-10-CM

## 2016-04-20 DIAGNOSIS — D250 Submucous leiomyoma of uterus: Secondary | ICD-10-CM | POA: Diagnosis not present

## 2016-04-20 NOTE — Progress Notes (Signed)
PELVIC US TA/TV: Heterogeneous anteverted uterus w/mult fibroids,(#1) fundal subserosal fibroid 7.1 x 6.3 x 7.1 cm,(#2) ant mid uterus submucosal fibroid 1.3 x 1 x 1.3 cm,EEC 7.9 mm,normal ov's bilat,no free fluid,no pain during ultrasound,ov's appear mobile

## 2016-04-20 NOTE — Progress Notes (Signed)
   [facility] Visit  04/20/16          Patient name: Christina Arroyo MRN [id number]  Date of birth: 1971/05/15  CC & HPI:  Christina Arroyo is a 45 y.o. female presenting today for discussion of uterine fibroid management. She was last seen on 04/13/16 and was advised to return to discuss treatment options of IUD vs OCP vs hysterectomy.   She states her current periods are 7-8 days in length but states the bleeding is thinner than it used to be when she was younger. She states the periods are coming closer together and sometimes has spotting between periods.   ROS:  ROS No new complaints at this time  Pertinent History Reviewed:   Reviewed: Significant for BTL, fibroids  Medical         Past Medical History:  Diagnosis Date  . Gall stones   . Hypertension                               Surgical Hx:    Past Surgical History:  Procedure Laterality Date  . CHOLECYSTECTOMY N/A 08/23/2013   Procedure: LAPAROSCOPIC CHOLECYSTECTOMY;  Surgeon: Jamesetta So, MD;  Location: [facility];  Service: General;  Laterality: N/A;  . COLONOSCOPY N/A 10/28/2015   Procedure: COLONOSCOPY;  Surgeon: Danie Binder, MD;  Location: [facility];  Service: Endoscopy;  Laterality: N/A;  8:30 Am  . TUBAL LIGATION  08/1997   Medications: Reviewed & Updated - see associated section                       Current Outpatient Prescriptions:  .  Cholecalciferol 4000 units CAPS, Take 1 capsule by mouth daily., Disp: , Rfl:  .  linaclotide (LINZESS) 72 MCG capsule, Take 1 capsule (72 mcg total) by mouth daily before breakfast. (Patient taking differently: Take 72 mcg by mouth daily as needed (constipation). ), Disp: 90 capsule, Rfl: 2 .  lisinopril (PRINIVIL,ZESTRIL) 20 MG tablet, TAKE ONE TABLET BY MOUTH DAILY., Disp: 90 tablet, Rfl: 0   Social History: Reviewed -  reports that she has never smoked. She has never used smokeless tobacco.  Objective Findings:  Vitals: Blood pressure 124/72, pulse 86, height 5'  (1.524 m), weight 213 lb 9.6 oz (96.9 kg), last menstrual period 03/26/2016.  Physical Examination:discussion only    Assessment & Plan:   A:  1. Uterine fibroids 2. Menorrhagia  3. Minimal endometrial distortion by anterior fibroid   P:  1. Options discussed   Hysterectomy, IUD, COC's,and will attempt IUD placement to avoid hysterectomy at this time 2. Return in 2 weeks for IUD insertion     By signing my name below, I, Christina Arroyo, attest that this documentation has been prepared under the direction and in the presence of Jonnie Kind, MD. Electronically Signed: Sonum Arroyo, Education administrator. 04/20/16. 2:34 PM.  I personally performed the services described in this documentation, which was SCRIBED in my presence. The recorded information has been reviewed and considered accurate. It has been edited as necessary during review. Jonnie Kind, MD

## 2016-05-06 ENCOUNTER — Ambulatory Visit (INDEPENDENT_AMBULATORY_CARE_PROVIDER_SITE_OTHER): Payer: BLUE CROSS/BLUE SHIELD | Admitting: Obstetrics and Gynecology

## 2016-05-06 ENCOUNTER — Encounter: Payer: Self-pay | Admitting: Obstetrics and Gynecology

## 2016-05-06 VITALS — BP 120/90 | HR 72 | Ht 60.0 in | Wt 212.0 lb

## 2016-05-06 DIAGNOSIS — Z3202 Encounter for pregnancy test, result negative: Secondary | ICD-10-CM

## 2016-05-06 DIAGNOSIS — Z30014 Encounter for initial prescription of intrauterine contraceptive device: Secondary | ICD-10-CM | POA: Diagnosis not present

## 2016-05-06 DIAGNOSIS — Z30430 Encounter for insertion of intrauterine contraceptive device: Secondary | ICD-10-CM

## 2016-05-06 DIAGNOSIS — Z30432 Encounter for removal of intrauterine contraceptive device: Secondary | ICD-10-CM | POA: Diagnosis not present

## 2016-05-06 DIAGNOSIS — Z975 Presence of (intrauterine) contraceptive device: Secondary | ICD-10-CM

## 2016-05-06 LAB — POCT URINE PREGNANCY: Preg Test, Ur: NEGATIVE

## 2016-05-06 NOTE — Progress Notes (Signed)
Christina Arroyo is a 45 y.o. No obstetric history on file. here for Mirena IUD removal and reinsertion. No GYN concerns. She states her LMP was Dec 5th-13th. Informed patient that inserting IUD when not actively bleeding may cause bleeding or spotting for several weeks after insertion. Patient understands and would still like to have this inserted today.   IUD Removal and Reinsertion  Patient identified, informed consent performed. Discussed risks of irregular bleeding, cramping, infection, malpositioning or misplacement of the IUD outside the uterus which may require further procedures. Time out was performed. Speculum placed in the vagina. Cervix visualized. Cleaned with Betadine x 2. Grasped anteriorly with a single tooth tenaculum.   Uterus sounded to  9 cm.  Mirena IUD placed per manufacturer's recommendations. Strings trimmed to 2-3 cm. Tenaculum was removed, good hemostasis noted. Patient tolerated procedure well. Patient was given post-procedure instructions.  Patient was also asked to check IUD strings periodically and offered follow up in 4 weeks for IUD check.  By signing my name below, I, Sonum Patel, attest that this documentation has been prepared under the direction and in the presence of Jonnie Kind, MD. Electronically Signed: Sonum Patel, Education administrator. 05/06/16. 9:38 AM.  I personally performed the services described in this documentation, which was SCRIBED in my presence. The recorded information has been reviewed and considered accurate. It has been edited as necessary during review. Jonnie Kind, MD

## 2016-06-03 ENCOUNTER — Ambulatory Visit: Payer: BLUE CROSS/BLUE SHIELD | Admitting: Obstetrics and Gynecology

## 2016-08-06 ENCOUNTER — Other Ambulatory Visit: Payer: Self-pay | Admitting: Physician Assistant

## 2016-08-06 DIAGNOSIS — I10 Essential (primary) hypertension: Secondary | ICD-10-CM

## 2016-08-06 NOTE — Telephone Encounter (Signed)
Refill appropriate 

## 2016-09-28 ENCOUNTER — Encounter: Payer: Self-pay | Admitting: Physician Assistant

## 2016-09-28 ENCOUNTER — Ambulatory Visit (INDEPENDENT_AMBULATORY_CARE_PROVIDER_SITE_OTHER): Payer: BLUE CROSS/BLUE SHIELD | Admitting: Physician Assistant

## 2016-09-28 VITALS — BP 120/92 | HR 74 | Temp 98.4°F | Resp 14 | Wt 216.2 lb

## 2016-09-28 DIAGNOSIS — R0981 Nasal congestion: Secondary | ICD-10-CM | POA: Diagnosis not present

## 2016-09-28 DIAGNOSIS — E559 Vitamin D deficiency, unspecified: Secondary | ICD-10-CM | POA: Diagnosis not present

## 2016-09-28 DIAGNOSIS — Z800 Family history of malignant neoplasm of digestive organs: Secondary | ICD-10-CM

## 2016-09-28 DIAGNOSIS — I10 Essential (primary) hypertension: Secondary | ICD-10-CM

## 2016-09-28 DIAGNOSIS — K5909 Other constipation: Secondary | ICD-10-CM

## 2016-09-28 MED ORDER — FLUTICASONE PROPIONATE 50 MCG/ACT NA SUSP: 2.0000 | Freq: Every day | NASAL | 6 refills | Status: DC

## 2016-09-28 NOTE — Progress Notes (Signed)
Patient ID: Christina Arroyo MRN: [id number], DOB: 1970/12/14, 46 y.o. Date of Encounter: 09/28/2016,   Chief Complaint: F/U Hypertension  HPI: 46 y.o. y/o AA female  here for above.  11/22/2014:  She is here for CPE and also being seen as a new patient to establish care with [place].  She is married with 3 children. They are 17,19, and 20. She says that the 46 year old is still at home but should graduate from high school this upcoming year. The other 2 are in college--one is at [employer] and one is at [employer]. 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 She says that she is off on Thursdays.  She says that she has lived in [place] "all her life". Says that her employer is now providing her insurance. Says in the past she was going to the [facility]. Says that she last had a "complete physical "with them 08/2013. Says that at that time they did a Pap Smear, which was normal. Also says that she had a mammogram performed at [facility] in either 08/2013 or 09/2013, which was normal.  Says that the main thing she was concerned about addressing today-- is that she checks her blood pressure and gets high readings of around 140/90 or around 140s over 100.  At that visit-- I prescribed lisinopril 10 mg daily. She was to return for labs and blood pressure check 1 week later. She did return for lab work but I do not see that she return to check blood pressure here.   05/15/2015: She reports that she has been taking the lisinopril 10 mg daily. He reports that she has been checking her blood pressure at home and is getting similar readings as what we are getting today which is 132/94. He says that the diastolic is always in the 90s when she checks it at home. Has no other complaints or concerns today. At that OV--Increased Lisinopril to 20mg  QD  05/29/2015: She reports she is taking the Lisinopril 20mg  QD. No adverse effects. Has checked BP  once at home--got 118/84.  Reviewed that at lab 11/2014--Vitamin D was very low at 9. Told to take Ergocalciferol 50,000 units once a week for 12 weeks then otc Vit D 4,000 Units QD---Pt says this is what she did--and is taking otc 4,000 QD currently.  09/19/2015:  She reports that she has felt some dysuria recently. No fever/chills. No back pain. She is taking the lisinopril 20 mg daily. Still checks her blood pressure at home and getting good readings. She is still taking the vitamin D 4000 units daily. Reviewed that at prior OV she reported that she was taking Linzess just 2-3 times per week b/c it was causing diarrhea o/w. At today's Ov, discussed that they now have a lower dose available--changed to the 77 mcg dose.  Says her position changed at work and no longer has a day off during the week.   03/30/2016: She says it was wondering she went to discuss with me. She says that on MRI in the past it coincidently showed a uterine fibroid. Says that recently her menses have been coming more frequently and she is having heavier bleeding. She is wondering if she needs to see a gynecologist. She saw Dr. Glo Herring in the past but says it was about 15 years ago. She has no other specific complaints or concerns today. She is taking her blood pressure medicine as  directed. No lightheadedness or other adverse effects. At last visit I decreased the Linzess dose down to the 77 dose. She says that even with this low dose she does not have to take it every day. Takes it 2-3 times a week as needed. She is still taking vitamin D supplement 4000 units daily. She had her colonoscopy in June and was told to repeat 5 years secondary to her family history. She is aware that she needs to schedule mammogram and has not had the one for 2017 yet.  09/28/2016: She is taking her blood pressure medicine as directed. No lightheadedness or other adverse effects. She only uses the Linzess 60mcg dose as needed--- even with this  low dose she does not have to take it every day. Does need it at least once a week. Says she has been eating better and that helps. She is still taking vitamin D supplement 4000 units daily. Has had a little bit of allergy symptoms and has had a little bit of bleeding from her left nares. No other concerns to discuss today. Otherwise has been feeling well.    Review of Systems: Consitutional: No fever, chills, fatigue, night sweats, lymphadenopathy. No significant/unexplained weight changes. Eyes: No visual changes, eye redness, or discharge. ENT/Mouth: No ear pain, sore throat, nasal drainage, or sinus pain. Cardiovascular: No chest pressure,heaviness, tightness or squeezing, even with exertion. No increased shortness of breath or dyspnea on exertion.No palpitations, edema, orthopnea, PND. Respiratory: No cough, hemoptysis, SOB, or wheezing. Gastrointestinal: No anorexia, dysphagia, reflux, pain, nausea, vomiting, hematemesis, diarrhea,  BRBPR, or melena. Breast: No mass, nodules, bulging, or retraction. No skin changes or inflammation. No nipple discharge. No lymphadenopathy. Genitourinary: No  hematuria, incontinence, vaginal discharge, pruritis, burning, abnormal bleeding, or pain. Musculoskeletal: No decreased ROM, No joint pain or swelling. No significant pain in neck, back, or extremities. Skin: No rash, pruritis, or concerning lesions. Neurological: No headache, dizziness, syncope, seizures, tremors, memory loss, coordination problems, or paresthesias. Psychological: No anxiety, depression, hallucinations, SI/HI. Endocrine: No polydipsia, polyphagia, polyuria, or known diabetes.No increased fatigue. No palpitations/rapid heart rate. No significant/unexplained weight change. All other systems were reviewed and are otherwise negative.  Past Medical History:  Diagnosis Date  . Gall stones   . Hypertension      Past Surgical History:  Procedure Laterality Date  . CHOLECYSTECTOMY  N/A 08/23/2013   Procedure: LAPAROSCOPIC CHOLECYSTECTOMY;  Surgeon: Jamesetta So, MD;  Location: [facility];  Service: General;  Laterality: N/A;  . COLONOSCOPY N/A 10/28/2015   Procedure: COLONOSCOPY;  Surgeon: Danie Binder, MD;  Location: [facility];  Service: Endoscopy;  Laterality: N/A;  8:30 Am  . TUBAL LIGATION  08/1997    Home Meds:  Outpatient Medications Prior to Visit  Medication Sig Dispense Refill  . Cholecalciferol 4000 units CAPS Take 1 capsule by mouth daily.    Marland Kitchen linaclotide (LINZESS) 72 MCG capsule Take 1 capsule (72 mcg total) by mouth daily before breakfast. (Patient taking differently: Take 72 mcg by mouth daily as needed (constipation). ) 90 capsule 2  . lisinopril (PRINIVIL,ZESTRIL) 20 MG tablet TAKE ONE TABLET BY MOUTH DAILY. 90 tablet 0   No facility-administered medications prior to visit.     Allergies: No Known Allergies  Social History   Social History  . Marital status: Married    Spouse name: N/A  . Number of children: N/A  . Years of education: N/A   Occupational History  . Not on file.   Social History Main  Topics  . Smoking status: Never Smoker  . Smokeless tobacco: Never Used  . Alcohol use No  . Drug use: No  . Sexual activity: Yes    Birth control/ protection: Surgical   Other Topics Concern  . Not on file   Social History Narrative   Entered 11/2014:    Married. 3 children--Ages 17,19,20 y/o.   ----36 y/o still at home--should graduate HS upcoming year.   --Other 2 are in colleg--1 at [employer]. 1 at [employer].       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         Family History  Problem Relation Age of Onset  . Hypertension Father   . Cancer Father 66       colon? and prostate  . Pulmonary embolism Mother   . Cancer Paternal Grandfather     Physical Exam: Blood pressure (!) 120/92, pulse 74, temperature 98.4 F (36.9 C), temperature source Oral, resp. rate 14, weight 216 lb 3.2  oz (98.1 kg), last menstrual period 09/17/2016, SpO2 98 %., Body mass index is 42.22 kg/m. General: Obese AAF. Appears in no acute distress. HEENT: Nares with erythema, swelling Neck: Supple. Trachea midline. No thyromegaly. Full ROM. No lymphadenopathy.No Carotid Bruits. Lungs: Clear to auscultation bilaterally without wheezes, rales, or rhonchi. Breathing is of normal effort and unlabored. Cardiovascular: RRR with S1 S2. No murmurs, rubs, or gallops.  No carotid  bruits. Musculoskeletal: Full range of motion and 5/5 strength throughout. Skin: Warm and moist without erythema, ecchymosis, wounds, or rash. Neuro: A+Ox3. CN II-XII grossly intact. Moves all extremities spontaneously.  Normal gait. Psych:  Responds to questions appropriately with a normal affect.   Assessment/Plan:  46 y.o. y/o female here for     Essential hypertension 09/28/2016: BP now at goal/controlled. Cont Lisinopril 20mg  QD. Check lab to monitor.   Chronic constipation She states that she does not have to take the Linzess every single day but does use it about 2-3 times per week, on average. At OV 09/19/2015--changed dose to Linzess 14mcg daily 09/28/2016: --she reports even with this lower dose she still doesn't have to take it every single day but does use it at least once a week - linaclotide (LINZESS) 72 MCG capsule; Take 1 capsule (72 mcg total) by mouth daily before breakfast.  Dispense: 90 capsule; Refill: 2  Vitamin D deficiency Reviewed that at lab 11/2014--Vitamin D was very low at 9. Told to take Ergocalciferol 50,000 units once a week for 12 weeks then otc Vit D 4,000 Units QD- -At [place] 05/29/2015: -Pt says this is what she did--and is taking otc 4,000 QD currently. At [place] 05/29/2015---Recheck Vit D level 09/28/2016:  reviewed that I check vitamin D level on lab 09/2015. At that time vitamin D level was good at 50. She is to continue current dose of 4000 units daily.  Family history of colon cancer 09/28/2016: She  had colonoscopy 10/2015. Repeat 5 years.  Uterine leiomyoma, unspecified location 03/30/2016: Will refer her back to Dr. Glo Herring. - Ambulatory referral to Gynecology 09/28/2016: She did see Dr. Glo Herring GYN. States that she got Mirena.  Nasal congestion 09/28/2016: --Flonase added--to use PRN   Routine f/u OV 6 months, sooner if needed.     THE FOLLOWING IS COPIED FROM HER CPE 11/22/2014:  Visit for preventive health examination  A. Screening Labs: She is not fasting today but says that she is off on Thursdays and  can return this upcoming Thursday fasting for lab work. - CBC with Differential/Platelet; Future-----------------------------------------Normal - COMPLETE METABOLIC PANEL WITH GFR; Future------------------Normal - Lipid panel; Future--------------------------------------------------------------------Excellent--Trig-60, HDL- 71,  LDL- 110 - TSH; Future-------------------------------------------------------------------------Normal - Vit D  25 hydroxy (rtn osteoporosis monitoring); Future-------------------9---See Above   B. Pap: She states that she had Pap smear performed 08/2013 and was normal. Pelvic exam performed today but can wait to repeat Pap smear.  C. Screening Mammogram: She states that she had mammogram performed at [facility] in either April or May 2015 and was negative. She is agreeable for me to schedule her for follow-up mammogram. - MM Digital Screening; Future She did have mammogram 12/17/2014  D. DEXA/BMD:  This does not need to performed until around age 31.  E. Colorectal Cancer Screening: Her father was diagnosed with colon cancer at age 61. Patient is currently age 42. Therefore we'll go ahead and refer her to GI to see if she needs to go ahead and start screening colonoscopy, as she is 10 years less than his age at diagnosis. Discussed this with her today and she is agreeable - Ambulatory referral to [facility] 09/19/2015--discussed that  she never went to appt. Work schedule changed. Has no day off during week. Agreeable for me to order Referral again --added to schedule at end of day around 4pm.   F. Immunizations:  Influenza:---N/A--July. Follow-up visit 05/15/15 discussed flu vaccine but she defers. Tetanus:--- She has had no tetanus in > 10 years. Agreeable to update today.  Tdap--given here 11/22/2014 Pneumococcal:  She has no indication to require a pneumonia vaccine until age 58. Zostavax: Not indicated until age 58.   Family history of colon cancer See #1-E Above.  - Ambulatory referral to Gastroenterology   Signed, Olean Ree [place], [place], [facility] 09/28/2016 8:25 AM

## 2016-09-29 LAB — BASIC METABOLIC PANEL WITH GFR
BUN: 13 mg/dL (ref 7–25)
CO2: 25 mmol/L (ref 20–31)
Calcium: 9.8 mg/dL (ref 8.6–10.2)
Chloride: 100 mmol/L (ref 98–110)
Creat: 0.77 mg/dL (ref 0.50–1.10)
GFR, Est African American: >89 mL/min (ref >=60)
GFR, Est Non African American: >89 mL/min (ref >=60)
Glucose, Bld: 129 mg/dL — ABNORMAL HIGH (ref 70–99)
Potassium: 4.9 mmol/L (ref 3.5–5.3)
Sodium: 135 mmol/L (ref 135–146)

## 2016-11-10 ENCOUNTER — Other Ambulatory Visit: Payer: Self-pay | Admitting: Physician Assistant

## 2016-11-10 DIAGNOSIS — K5909 Other constipation: Secondary | ICD-10-CM

## 2016-11-10 NOTE — Telephone Encounter (Signed)
Refill appropriate 

## 2016-11-20 ENCOUNTER — Other Ambulatory Visit: Payer: Self-pay | Admitting: Physician Assistant

## 2016-11-20 DIAGNOSIS — I10 Essential (primary) hypertension: Secondary | ICD-10-CM

## 2016-11-20 NOTE — Telephone Encounter (Signed)
Refill appropriate 

## 2016-12-23 ENCOUNTER — Encounter: Payer: Self-pay | Admitting: Family Medicine

## 2016-12-23 ENCOUNTER — Ambulatory Visit (INDEPENDENT_AMBULATORY_CARE_PROVIDER_SITE_OTHER): Payer: BLUE CROSS/BLUE SHIELD | Admitting: Family Medicine

## 2016-12-23 VITALS — BP 132/70 | HR 82 | Temp 98.4°F | Resp 14 | Ht 60.0 in | Wt 220.0 lb

## 2016-12-23 DIAGNOSIS — N390 Urinary tract infection, site not specified: Secondary | ICD-10-CM

## 2016-12-23 DIAGNOSIS — R319 Hematuria, unspecified: Secondary | ICD-10-CM | POA: Diagnosis not present

## 2016-12-23 DIAGNOSIS — I10 Essential (primary) hypertension: Secondary | ICD-10-CM | POA: Diagnosis not present

## 2016-12-23 DIAGNOSIS — R300 Dysuria: Secondary | ICD-10-CM | POA: Diagnosis not present

## 2016-12-23 LAB — URINALYSIS, ROUTINE W REFLEX MICROSCOPIC
Bilirubin Urine: NEGATIVE
Glucose, UA: NEGATIVE
Ketones, ur: NEGATIVE
Nitrite: NEGATIVE
Specific Gravity, Urine: 1.02 (ref 1.001–1.035)
pH: 6.5 (ref 5.0–8.0)

## 2016-12-23 LAB — URINALYSIS, MICROSCOPIC ONLY
Bacteria, UA: NONE SEEN [HPF]
Casts: NONE SEEN [LPF]
Crystals: NONE SEEN [HPF]
Yeast: NONE SEEN [HPF]

## 2016-12-23 MED ORDER — CEPHALEXIN 500 MG PO CAPS: 500.0000 mg | ORAL_CAPSULE | Freq: Four times a day (QID) | ORAL | 0 refills | Status: DC

## 2016-12-23 NOTE — Assessment & Plan Note (Signed)
Blood pressure is controlled. I did spend some time discussing her weight and noticed her weight had increased up to 220 pounds. She did state that she had some dust in her family including her father the past few months she has been stress eating eating out a lot. We discussed meal prepping cutting out the processed foods. Try to decrease her risk of developing coronary artery disease.

## 2016-12-23 NOTE — Progress Notes (Signed)
   Subjective:    Patient ID: Christina Arroyo, female    DOB: 1970/05/24, 46 y.o.   MRN: [id number]  Patient presents for Dysuria (x1 week- burning with urination)   Patient with dysuria for the past week. She's had pressure and burning with urination. She has had urinary tract infections in the past. Note at the end of this she did tell me she was getting some spotting she does have IUD because of fibroids. She's not had any vomiting no fever no chills she has been using cranberry juice. Her bowels have been moving well and she does not rely on the Linzess.    Review Of Systems:  GEN- denies fatigue, fever, weight loss,weakness, recent illness HEENT- denies eye drainage, change in vision, nasal discharge, CVS- denies chest pain, palpitations RESP- denies SOB, cough, wheeze ABD- denies N/V, change in stools, abd pain GU- + dysuria, hematuria, dribbling, incontinence MSK- denies joint pain, muscle aches, injury Neuro- denies headache, dizziness, syncope, seizure activity       Objective:    BP 132/70   Pulse 82   Temp 98.4 F (36.9 C) (Oral)   Resp 14   Ht 5' (1.524 m)   Wt 220 lb (99.8 kg)   LMP 12/21/2016 Comment: regular  SpO2 99%   BMI 42.97 kg/m  GEN- NAD, alert and oriented x3 CVS- RRR, no murmur RESP-CTAB ABD-NABS,soft,NT,ND, no CVA tenderness  EXT- No edema Pulses- Radial 2+        Assessment & Plan:      Problem List Items Addressed This Visit    Hypertension    Blood pressure is controlled. I did spend some time discussing her weight and noticed her weight had increased up to 220 pounds. She did state that she had some dust in her family including her father the past few months she has been stress eating eating out a lot. We discussed meal prepping cutting out the processed foods. Try to decrease her risk of developing coronary artery disease.       Other Visit Diagnoses    Urinary tract infection with hematuria, site unspecified    -  Primary   Treat  with Keflex 4 times a day 5 days   Relevant Medications   cephALEXin (KEFLEX) 500 MG capsule   Other Relevant Orders   Urinalysis, Routine w reflex microscopic (Completed)      Note: This dictation was prepared with Dragon dictation along with smaller phrase technology. Any transcriptional errors that result from this process are unintentional.

## 2016-12-23 NOTE — Patient Instructions (Addendum)
Take keflex F/U as needed

## 2017-03-03 ENCOUNTER — Other Ambulatory Visit: Payer: Self-pay | Admitting: Physician Assistant

## 2017-03-03 DIAGNOSIS — I10 Essential (primary) hypertension: Secondary | ICD-10-CM

## 2017-03-31 ENCOUNTER — Ambulatory Visit: Payer: BLUE CROSS/BLUE SHIELD | Admitting: Physician Assistant

## 2017-04-01 ENCOUNTER — Other Ambulatory Visit: Payer: Self-pay

## 2017-04-01 ENCOUNTER — Ambulatory Visit: Payer: BLUE CROSS/BLUE SHIELD | Admitting: Physician Assistant

## 2017-04-01 ENCOUNTER — Encounter: Payer: Self-pay | Admitting: Physician Assistant

## 2017-04-01 VITALS — BP 110/82 | HR 89 | Temp 97.9°F | Resp 16 | Ht 60.0 in | Wt 224.6 lb

## 2017-04-01 DIAGNOSIS — E559 Vitamin D deficiency, unspecified: Secondary | ICD-10-CM | POA: Diagnosis not present

## 2017-04-01 DIAGNOSIS — K5909 Other constipation: Secondary | ICD-10-CM

## 2017-04-01 DIAGNOSIS — I10 Essential (primary) hypertension: Secondary | ICD-10-CM

## 2017-04-01 NOTE — Progress Notes (Signed)
Patient ID: Christina Arroyo MRN: [id number], DOB: [birth year], 46 y.o. Date of Encounter: 04/01/2017,   Chief Complaint: F/U Hypertension  HPI: 46 y.o. y/o AA female  here for above.  11/22/2014:  She is here for CPE and also being seen as a new patient to establish care with [place].  She is married with 3 children. They are 17,19, and 20. She says that the 46 year old is still at home but should graduate from high school this upcoming year. The other 2 are in college--one is at [employer] and one is at [employer]. 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 She says that she is off on Thursdays.  She says that she has lived in [place] "all her life". Says that her employer is now providing her insurance. Says in the past she was going to the [facility]. Says that she last had a "complete physical "with them 08/2013. Says that at that time they did a Pap Smear, which was normal. Also says that she had a mammogram performed at [facility] in either 08/2013 or 09/2013, which was normal.  Says that the main thing she was concerned about addressing today-- is that she checks her blood pressure and gets high readings of around 140/90 or around 140s over 100.  At that visit-- I prescribed lisinopril 10 mg daily. She was to return for labs and blood pressure check 1 week later. She did return for lab work but I do not see that she return to check blood pressure here.   05/15/2015: She reports that she has been taking the lisinopril 10 mg daily. He reports that she has been checking her blood pressure at home and is getting similar readings as what we are getting today which is 132/94. He says that the diastolic is always in the 90s when she checks it at home. Has no other complaints or concerns today. At that OV--Increased Lisinopril to 20mg  QD  05/29/2015: She reports she is taking the Lisinopril 20mg  QD. No adverse effects. Has checked BP  once at home--got 118/84.  Reviewed that at lab 11/2014--Vitamin D was very low at 9. Told to take Ergocalciferol 50,000 units once a week for 12 weeks then otc Vit D 4,000 Units QD---Pt says this is what she did--and is taking otc 4,000 QD currently.  09/19/2015:  She reports that she has felt some dysuria recently. No fever/chills. No back pain. She is taking the lisinopril 20 mg daily. Still checks her blood pressure at home and getting good readings. She is still taking the vitamin D 4000 units daily. Reviewed that at prior OV she reported that she was taking Linzess just 2-3 times per week b/c it was causing diarrhea o/w. At today's Ov, discussed that they now have a lower dose available--changed to the 77 mcg dose.  Says her position changed at work and no longer has a day off during the week.   03/30/2016: She says it was wondering she went to discuss with me. She says that on MRI in the past it coincidently showed a uterine fibroid. Says that recently her menses have been coming more frequently and she is having heavier bleeding. She is wondering if she needs to see a gynecologist. She saw Dr. Glo Herring in the past but says it was about 15 years ago. She has no other specific complaints or concerns today. She is taking her blood pressure medicine as  directed. No lightheadedness or other adverse effects. At last visit I decreased the Linzess dose down to the 77 dose. She says that even with this low dose she does not have to take it every day. Takes it 2-3 times a week as needed. She is still taking vitamin D supplement 4000 units daily. She had her colonoscopy in June and was told to repeat 5 years secondary to her family history. She is aware that she needs to schedule mammogram and has not had the one for 2017 yet.  09/28/2016: She is taking her blood pressure medicine as directed. No lightheadedness or other adverse effects. She only uses the Linzess 33mcg dose as needed--- even with this  low dose she does not have to take it every day. Does need it at least once a week. Says she has been eating better and that helps. She is still taking vitamin D supplement 4000 units daily. Has had a little bit of allergy symptoms and has had a little bit of bleeding from her left nares. No other concerns to discuss today. Otherwise has been feeling well.  04/01/2017:  Has no concerns today.  She has been feeling well. Taking blood pressure medication daily.  No lightheadedness or other adverse effects. States that she takes the [place] only as needed.  Does not need it every day. States that she does take her vitamin D every single day and is taking the 4000 units.   Review of Systems: Consitutional: No fever, chills, fatigue, night sweats, lymphadenopathy. No significant/unexplained weight changes. Eyes: No visual changes, eye redness, or discharge. ENT/Mouth: No ear pain, sore throat, nasal drainage, or sinus pain. Cardiovascular: No chest pressure,heaviness, tightness or squeezing, even with exertion. No increased shortness of breath or dyspnea on exertion.No palpitations, edema, orthopnea, PND. Respiratory: No cough, hemoptysis, SOB, or wheezing. Gastrointestinal: No anorexia, dysphagia, reflux, pain, nausea, vomiting, hematemesis, diarrhea,  BRBPR, or melena. Breast: No mass, nodules, bulging, or retraction. No skin changes or inflammation. No nipple discharge. No lymphadenopathy. Genitourinary: No  hematuria, incontinence, vaginal discharge, pruritis, burning, abnormal bleeding, or pain. Musculoskeletal: No decreased ROM, No joint pain or swelling. No significant pain in neck, back, or extremities. Skin: No rash, pruritis, or concerning lesions. Neurological: No headache, dizziness, syncope, seizures, tremors, memory loss, coordination problems, or paresthesias. Psychological: No anxiety, depression, hallucinations, SI/HI. Endocrine: No polydipsia, polyphagia, polyuria, or known  diabetes.No increased fatigue. No palpitations/rapid heart rate. No significant/unexplained weight change. All other systems were reviewed and are otherwise negative.  Past Medical History:  Diagnosis Date  . Gall stones   . Hypertension      Past Surgical History:  Procedure Laterality Date  . CHOLECYSTECTOMY N/A 08/23/2013   Procedure: LAPAROSCOPIC CHOLECYSTECTOMY;  Surgeon: Jamesetta So, MD;  Location: [facility];  Service: General;  Laterality: N/A;  . COLONOSCOPY N/A 10/28/2015   Procedure: COLONOSCOPY;  Surgeon: Danie Binder, MD;  Location: [facility];  Service: Endoscopy;  Laterality: N/A;  8:30 Am  . TUBAL LIGATION  08/1997    Home Meds:  Outpatient Medications Prior to Visit  Medication Sig Dispense Refill  . Cholecalciferol 4000 units CAPS Take 1 capsule by mouth daily.    . fluticasone (FLONASE) 50 MCG/ACT nasal spray Place 2 sprays into both nostrils daily. 16 g 6  . LINZESS 72 MCG capsule TAKE (1) CAPSULE BY MOUTH EVERY DAY.TAKE EACH MORNING BEFORE EATING/DRINKING. 30 capsule 0  . lisinopril (PRINIVIL,ZESTRIL) 20 MG tablet TAKE ONE TABLET BY MOUTH DAILY. 90 tablet  0  . cephALEXin (KEFLEX) 500 MG capsule Take 1 capsule (500 mg total) by mouth 4 (four) times daily. 20 capsule 0   No facility-administered medications prior to visit.     Allergies: No Known Allergies  Social History   Socioeconomic History  . Marital status: Married    Spouse name: Not on file  . Number of children: Not on file  . Years of education: Not on file  . Highest education level: Not on file  Social Needs  . Financial resource strain: Not on file  . Food insecurity - worry: Not on file  . Food insecurity - inability: Not on file  . Transportation needs - medical: Not on file  . Transportation needs - non-medical: Not on file  Occupational History  . Not on file  Tobacco Use  . Smoking status: Never Smoker  . Smokeless tobacco: Never Used  Substance and Sexual Activity  . Alcohol  use: No  . Drug use: No  . Sexual activity: Yes    Birth control/protection: Surgical  Other Topics Concern  . Not on file  Social History Narrative   Entered 11/2014:    Married. 3 children--Ages 17,19,20 y/o.   ----72 y/o still at home--should graduate HS upcoming year.   --Other 2 are in colleg--1 at [employer]. 1 at [employer].       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      Family History  Problem Relation Age of Onset  . Hypertension Father   . Cancer Father 11       colon? and prostate  . Pulmonary embolism Mother   . Cancer Paternal Grandfather     Physical Exam: Blood pressure 110/82, pulse 89, temperature 97.9 F (36.6 C), temperature source Oral, resp. rate 16, height 5' (1.524 m), weight 101.9 kg (224 lb 9.6 oz), last menstrual period 03/29/2017, SpO2 98 %., Body mass index is 43.86 kg/m. General: Obese AAF. Appears in no acute distress. HEENT: Nares with erythema, swelling Neck: Supple. Trachea midline. No thyromegaly. Full ROM. No lymphadenopathy.No Carotid Bruits. Lungs: Clear to auscultation bilaterally without wheezes, rales, or rhonchi. Breathing is of normal effort and unlabored. Cardiovascular: RRR with S1 S2. No murmurs, rubs, or gallops.  No carotid  bruits. Musculoskeletal: Full range of motion and 5/5 strength throughout. Skin: Warm and moist without erythema, ecchymosis, wounds, or rash. Neuro: A+Ox3. CN II-XII grossly intact. Moves all extremities spontaneously.  Normal gait. Psych:  Responds to questions appropriately with a normal affect.   Assessment/Plan:  46 y.o. y/o female here for     Essential hypertension 11/152018: BP now at goal/controlled. Cont Lisinopril 20mg  QD. Check lab to monitor.   Chronic constipation She states that she does not have to take the Linzess every single day but does use it about 2-3 times per week, on average. At OV 09/19/2015--changed dose to Linzess 26mcg  daily 04/01/2017: --she reports even with this lower dose she still doesn't have to take it every single day but does use it at least once a week - linaclotide (LINZESS) 72 MCG capsule; Take 1 capsule (72 mcg total) by mouth daily before breakfast.  Dispense: 90 capsule; Refill: 2  Vitamin D deficiency Reviewed that at lab 11/2014--Vitamin D was very low at 9. Told to take Ergocalciferol 50,000 units once a week for 12 weeks then otc Vit D 4,000 Units QD- -At OV 05/29/2015: -Pt says this is what she did--and  is taking otc 4,000 QD currently. At [place] 05/29/2015---Recheck Vit D level 09/28/2016:  reviewed that I check vitamin D level on lab 09/2015. At that time vitamin D level was good at 50. She is to continue current dose of 4000 units daily. 04/01/2017: Recheck vitamin D level with labs today.  This was last checked May 2017.  Family history of colon cancer 04/01/2017: She had colonoscopy 10/2015. Repeat 5 years.  Uterine leiomyoma, unspecified location 03/30/2016: Will refer her back to Dr. Glo Herring. - Ambulatory referral to Gynecology 09/2016: She did see Dr. Glo Herring GYN. States that she got Mirena. 04/01/2017: Managed by Gyn  Nasal congestion 09/28/2016: --Flonase added--to use PRN 04/01/2017: This is currently stable.  Routine f/u OV 6 months, sooner if needed.     THE FOLLOWING IS COPIED FROM HER CPE 11/22/2014:  Visit for preventive health examination  A. Screening Labs: She is not fasting today but says that she is off on Thursdays and can return this upcoming Thursday fasting for lab work. - CBC with Differential/Platelet; Future-----------------------------------------Normal - COMPLETE METABOLIC PANEL WITH GFR; Future------------------Normal - Lipid panel; Future--------------------------------------------------------------------Excellent--Trig-60, HDL- 71,  LDL- 110 - TSH; Future-------------------------------------------------------------------------Normal - Vit D  25 hydroxy  (rtn osteoporosis monitoring); Future-------------------9---See Above   B. Pap: She states that she had Pap smear performed 08/2013 and was normal. Pelvic exam performed today but can wait to repeat Pap smear.  C. Screening Mammogram: She states that she had mammogram performed at [facility] in either April or May 2015 and was negative. She is agreeable for me to schedule her for follow-up mammogram. - MM Digital Screening; Future She did have mammogram 12/17/2014  D. DEXA/BMD:  This does not need to performed until around age 38.  E. Colorectal Cancer Screening: Her father was diagnosed with colon cancer at age 65. Patient is currently age 69. Therefore we'll go ahead and refer her to GI to see if she needs to go ahead and start screening colonoscopy, as she is 10 years less than his age at diagnosis. Discussed this with her today and she is agreeable - [facility]ulatory referral to [facility] 09/19/2015--discussed that she never went to appt. Work schedule changed. Has no day off during week. Agreeable for me to order Referral again --added to schedule at end of day around 4pm.   F. Immunizations:  Influenza:---N/A--July. Follow-up visit 05/15/15 discussed flu vaccine but she defers. Tetanus:--- She has had no tetanus in > 10 years. Agreeable to update today.  Tdap--given here 11/22/2014 Pneumococcal:  She has no indication to require a pneumonia vaccine until age 66. Zostavax: Not indicated until age 65.   Family history of colon cancer See #1-E Above.  - Ambulatory referral to Gastroenterology   Signed, Olean Ree [place], [place], [facility] 04/01/2017 10:34 AM

## 2017-04-02 LAB — BASIC METABOLIC PANEL WITH GFR
BUN: 11 mg/dL (ref 7–25)
CO2: 28 mmol/L (ref 20–32)
Calcium: 9.6 mg/dL (ref 8.6–10.2)
Chloride: 105 mmol/L (ref 98–110)
Creat: 0.72 mg/dL (ref 0.50–1.10)
GFR, Est African American: 117 mL/min/{1.73_m2} (ref >=60)
GFR, Est Non African American: 101 mL/min/{1.73_m2} (ref >=60)
Glucose, Bld: 104 mg/dL — ABNORMAL HIGH (ref 65–99)
Potassium: 5.4 mmol/L — ABNORMAL HIGH (ref 3.5–5.3)
Sodium: 139 mmol/L (ref 135–146)

## 2017-04-02 LAB — VITAMIN D 25 HYDROXY (VIT D DEFICIENCY, FRACTURES): Vit D, 25-Hydroxy: 62 ng/mL (ref 30–100)

## 2017-04-02 MED ORDER — CHOLECALCIFEROL 50 MCG (2000 UT) PO CAPS: 2000.0000 [IU] | ORAL_CAPSULE | Freq: Every day | ORAL | 4 refills | Status: AC

## 2017-04-02 NOTE — Telephone Encounter (Signed)
-----   Message from Orlena Sheldon, PA-C sent at 04/02/2017  9:03 AM EST ----- Can decrease Vit D to 2,00 units daily---please remove prior dose from med list.  Other lab stable.

## 2017-05-01 ENCOUNTER — Other Ambulatory Visit: Payer: Self-pay | Admitting: Physician Assistant

## 2017-05-01 DIAGNOSIS — K5909 Other constipation: Secondary | ICD-10-CM

## 2017-06-17 ENCOUNTER — Other Ambulatory Visit: Payer: Self-pay | Admitting: Physician Assistant

## 2017-06-17 DIAGNOSIS — I10 Essential (primary) hypertension: Secondary | ICD-10-CM

## 2017-06-18 NOTE — Telephone Encounter (Signed)
Refill appropriate 

## 2017-09-29 ENCOUNTER — Ambulatory Visit: Payer: BLUE CROSS/BLUE SHIELD | Admitting: Physician Assistant

## 2017-09-29 ENCOUNTER — Other Ambulatory Visit: Payer: Self-pay | Admitting: Physician Assistant

## 2017-09-29 ENCOUNTER — Encounter: Payer: Self-pay | Admitting: Physician Assistant

## 2017-09-29 ENCOUNTER — Other Ambulatory Visit: Payer: Self-pay

## 2017-09-29 VITALS — BP 122/84 | HR 62 | Temp 97.7°F | Resp 16 | Ht 60.0 in | Wt 223.2 lb

## 2017-09-29 DIAGNOSIS — Z800 Family history of malignant neoplasm of digestive organs: Secondary | ICD-10-CM | POA: Diagnosis not present

## 2017-09-29 DIAGNOSIS — K5909 Other constipation: Secondary | ICD-10-CM | POA: Diagnosis not present

## 2017-09-29 DIAGNOSIS — I10 Essential (primary) hypertension: Secondary | ICD-10-CM

## 2017-09-29 DIAGNOSIS — E559 Vitamin D deficiency, unspecified: Secondary | ICD-10-CM | POA: Diagnosis not present

## 2017-09-29 DIAGNOSIS — Z1231 Encounter for screening mammogram for malignant neoplasm of breast: Secondary | ICD-10-CM

## 2017-09-29 NOTE — Progress Notes (Signed)
Patient ID: Christina Arroyo MRN: [id number], DOB: 1970/10/05, 47 y.o. Date of Encounter: 09/29/2017,   Chief Complaint: F/U Hypertension  HPI: 47 y.o. y/o AA female  here for above.  11/22/2014:  She is here for CPE and also being seen as a new patient to establish care with [place].  She is married with 3 children. They are 17,19, and 20. She says that the 47 year old is still at home but should graduate from high school this upcoming year. The other 2 are in college--one is at [employer] and one is at [employer]. 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 She says that she is off on Thursdays.  She says that she has lived in [place] "all her life". Says that her employer is now providing her insurance. Says in the past she was going to the [facility]. Says that she last had a "complete physical "with them 08/2013. Says that at that time they did a Pap Smear, which was normal. Also says that she had a mammogram performed at [facility] in either 08/2013 or 09/2013, which was normal.  Says that the main thing she was concerned about addressing today-- is that she checks her blood pressure and gets high readings of around 140/90 or around 140s over 100.  At that visit-- I prescribed lisinopril 10 mg daily. She was to return for labs and blood pressure check 1 week later. She did return for lab work but I do not see that she return to check blood pressure here.   05/15/2015: She reports that she has been taking the lisinopril 10 mg daily. He reports that she has been checking her blood pressure at home and is getting similar readings as what we are getting today which is 132/94. He says that the diastolic is always in the 90s when she checks it at home. Has no other complaints or concerns today. At that OV--Increased Lisinopril to 20mg  QD  05/29/2015: She reports she is taking the Lisinopril 20mg  QD. No adverse effects. Has checked BP  once at home--got 118/84.  Reviewed that at lab 11/2014--Vitamin D was very low at 9. Told to take Ergocalciferol 50,000 units once a week for 12 weeks then otc Vit D 4,000 Units QD---Pt says this is what she did--and is taking otc 4,000 QD currently.  09/19/2015:  She reports that she has felt some dysuria recently. No fever/chills. No back pain. She is taking the lisinopril 20 mg daily. Still checks her blood pressure at home and getting good readings. She is still taking the vitamin D 4000 units daily. Reviewed that at prior OV she reported that she was taking Linzess just 2-3 times per week b/c it was causing diarrhea o/w. At today's Ov, discussed that they now have a lower dose available--changed to the 77 mcg dose.  Says her position changed at work and no longer has a day off during the week.   03/30/2016: She says it was wondering she went to discuss with me. She says that on MRI in the past it coincidently showed a uterine fibroid. Says that recently her menses have been coming more frequently and she is having heavier bleeding. She is wondering if she needs to see a gynecologist. She saw Dr. Glo Herring in the past but says it was about 15 years ago. She has no other specific complaints or concerns today. She is taking her blood pressure medicine as  directed. No lightheadedness or other adverse effects. At last visit I decreased the Linzess dose down to the 77 dose. She says that even with this low dose she does not have to take it every day. Takes it 2-3 times a week as needed. She is still taking vitamin D supplement 4000 units daily. She had her colonoscopy in June and was told to repeat 5 years secondary to her family history. She is aware that she needs to schedule mammogram and has not had the one for 2017 yet.  09/28/2016: She is taking her blood pressure medicine as directed. No lightheadedness or other adverse effects. She only uses the Linzess 74mcg dose as needed--- even with this  low dose she does not have to take it every day. Does need it at least once a week. Says she has been eating better and that helps. She is still taking vitamin D supplement 4000 units daily. Has had a little bit of allergy symptoms and has had a little bit of bleeding from her left nares. No other concerns to discuss today. Otherwise has been feeling well.  04/01/2017:  Has no concerns today.  She has been feeling well. Taking blood pressure medication daily.  No lightheadedness or other adverse effects. States that she takes the [place] only as needed.  Does not need it every day. States that she does take her vitamin D every single day and is taking the 4000 units.   09/29/2017: Today I reviewed that at her last lab -- 03/2017-- vitamin D level had increased significantly and was up to 62 but had risen significantly since prior lab.  Therefore at that time recommended to decrease from 4000 units daily to 2000 units/day.  Today she reports that she did decrease down to the 2000 units daily. She continues to take her blood pressure medication daily.  Continues to have no lightheadedness or other adverse effects. She continues to only need Linzess occasionally and uses that as needed but not every single day. She reports that everything has been stable and she has no new updates to report today, no new medical concerns to address.   Review of Systems: Consitutional: No fever, chills, fatigue, night sweats, lymphadenopathy. No significant/unexplained weight changes. Eyes: No visual changes, eye redness, or discharge. ENT/Mouth: No ear pain, sore throat, nasal drainage, or sinus pain. Cardiovascular: No chest pressure,heaviness, tightness or squeezing, even with exertion. No increased shortness of breath or dyspnea on exertion.No palpitations, edema, orthopnea, PND. Respiratory: No cough, hemoptysis, SOB, or wheezing. Gastrointestinal: No anorexia, dysphagia, reflux, pain, nausea, vomiting,  hematemesis, diarrhea,  BRBPR, or melena. Breast: No mass, nodules, bulging, or retraction. No skin changes or inflammation. No nipple discharge. No lymphadenopathy. Genitourinary: No  hematuria, incontinence, vaginal discharge, pruritis, burning, abnormal bleeding, or pain. Musculoskeletal: No decreased ROM, No joint pain or swelling. No significant pain in neck, back, or extremities. Skin: No rash, pruritis, or concerning lesions. Neurological: No headache, dizziness, syncope, seizures, tremors, memory loss, coordination problems, or paresthesias. Psychological: No anxiety, depression, hallucinations, SI/HI. Endocrine: No polydipsia, polyphagia, polyuria, or known diabetes.No increased fatigue. No palpitations/rapid heart rate. No significant/unexplained weight change. All other systems were reviewed and are otherwise negative.  Past Medical History:  Diagnosis Date  . Gall stones   . Hypertension      Past Surgical History:  Procedure Laterality Date  . CHOLECYSTECTOMY N/A 08/23/2013   Procedure: LAPAROSCOPIC CHOLECYSTECTOMY;  Surgeon: Jamesetta So, MD;  Location: [facility] ORS;  Service: General;  Laterality: N/A;  .  COLONOSCOPY N/A 10/28/2015   Procedure: COLONOSCOPY;  Surgeon: Danie Binder, MD;  Location: [facility] END[facility] SUITE;  Service: Endoscopy;  Laterality: N/A;  8:30 Am  . TUBAL LIGATION  08/1997    Home Meds:  Outpatient Medications Prior to Visit  Medication Sig Dispense Refill  . Cholecalciferol 2000 units CAPS Take 1 capsule (2,000 Units total) daily by mouth. 30 each 4  . fluticasone (FLONASE) 50 MCG/ACT nasal spray Place 2 sprays into both nostrils daily. 16 g 6  . LINZESS 72 MCG capsule TAKE (1) CAPSULE BY MOUTH EVERY DAY.TAKE EACH MORNING BEFORE EATING/DRINKING. 30 capsule 0  . lisinopril (PRINIVIL,ZESTRIL) 20 MG tablet TAKE ONE TABLET BY MOUTH DAILY. 90 tablet 0   No facility-administered medications prior to visit.     Allergies: No Known Allergies  Social History    Socioeconomic History  . Marital status: Married    Spouse name: Not on file  . Number of children: Not on file  . Years of education: Not on file  . Highest education level: Not on file  Occupational History  . Not on file  Social Needs  . Financial resource strain: Not on file  . Food insecurity:    Worry: Not on file    Inability: Not on file  . Transportation needs:    Medical: Not on file    Non-medical: Not on file  Tobacco Use  . Smoking status: Never Smoker  . Smokeless tobacco: Never Used  Substance and Sexual Activity  . Alcohol use: No  . Drug use: No  . Sexual activity: Yes    Birth control/protection: Surgical  Lifestyle  . Physical activity:    Days per week: Not on file    Minutes per session: Not on file  . Stress: Not on file  Relationships  . Social connections:    Talks on phone: Not on file    Gets together: Not on file    Attends religious service: Not on file    Active member of club or organization: Not on file    Attends meetings of clubs or organizations: Not on file    Relationship status: Not on file  . Intimate partner violence:    Fear of current or ex partner: Not on file    Emotionally abused: Not on file    Physically abused: Not on file    Forced sexual activity: Not on file  Other Topics Concern  . Not on file  Social History Narrative   Entered 11/2014:    Married. 3 children--Ages 17,19,20 y/o.   ----51 y/o still at home--should graduate HS upcoming year.   --Other 2 are in colleg--1 at [employer]. 1 at [employer].       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      Family History  Problem Relation Age of Onset  . Hypertension Father   . Cancer Father 54       colon? and prostate  . Pulmonary embolism Mother   . Cancer Paternal Grandfather     Physical Exam: Blood pressure 122/84, pulse 62, temperature 97.7 F (36.5 C), temperature source Oral, resp. rate 16, height 5' (1.524  m), weight 101.2 kg (223 lb 3.2 oz), last menstrual period 09/24/2017, SpO2 99 %., Body mass index is 43.59 kg/m. General: AAF. Appears in no acute distress. Neck: Supple. No thyromegaly. No lymphadenopathy. No carotid bruit. Lungs: Clear bilaterally to auscultation without wheezes, rales, or  rhonchi. Breathing is unlabored. Heart: RRR with S1 S2. No murmurs, rubs, or gallops. Abdomen: Soft, non-tender, non-distended with normoactive bowel sounds. No hepatomegaly. No rebound/guarding. No obvious abdominal masses. Musculoskeletal:  Strength and tone normal for age. Extremities/Skin: Warm and dry.  No Le edema.  Neuro: Alert and oriented X 3. Moves all extremities spontaneously. Gait is normal. CNII-XII grossly in tact. Psych:  Responds to questions appropriately with a normal affect.   Assessment/Plan:  47 y.o. y/o female here for     Essential hypertension 09/29/2017: BP now at goal/controlled. Cont Lisinopril 20mg  QD. Check lab to monitor.   Chronic constipation She states that she does not have to take the Linzess every single day but does use it about 2-3 times per week, on average. At OV 09/19/2015--changed dose to Linzess 71mcg daily 04/01/2017: --she reports even with this lower dose she still doesn't have to take it every single day but does use it at least once a week 09/29/2017--- Stable, controlled with using the low-dose Linzess 72 mcg as needed. - linaclotide (LINZESS) 72 MCG capsule; Take 1 capsule (72 mcg total) by mouth daily before breakfast.  Dispense: 90 capsule; Refill: 2  Vitamin D deficiency Reviewed that at lab 11/2014--Vitamin D was very low at 9. Told to take Ergocalciferol 50,000 units once a week for 12 weeks then otc Vit D 4,000 Units QD- -At [facility] 05/29/2015: -Pt says this is what she did--and is taking otc 4,000 QD currently. At [place] 05/29/2015---Recheck Vit D level 09/28/2016:  reviewed that I check vitamin D level on lab 09/2015. At that time vitamin D level was good  at 50. She is to continue current dose of 4000 units daily. 04/01/2017: Recheck vitamin D level with labs today.  This was last checked May 2017. 09/29/2017: I reviewed that at last lab 03/2017 vitamin D level was up to 62 and that it had been increasing significantly.  Therefore at that time said that she could decrease from 4000 units to 2000 units daily.  Today she confirms that she did decrease and has been taking just 2000 units daily since then.  Therefore will recheck lab today.  Family history of colon cancer 09/29/2017: She had colonoscopy 10/2015. Repeat 5 years.  Uterine leiomyoma, unspecified location 03/30/2016: Will refer her back to Dr. Glo Herring. - Ambulatory referral to Gynecology 09/2016: She did see Dr. Glo Herring GYN. States that she got Mirena. 09/29/2017: Managed by Gyn  Nasal congestion 09/28/2016: --Flonase added--to use PRN 04/01/2017: This is currently stable.  Routine f/u OV 6 months, sooner if needed.    For PREVENTIVE CARE----See CPE note 11/22/2014   Signed, [facility], [place], [facility] 09/29/2017 9:34 AM

## 2017-09-30 LAB — BASIC METABOLIC PANEL WITH GFR
BUN: 13 mg/dL (ref 7–25)
CO2: 27 mmol/L (ref 20–32)
Calcium: 9.9 mg/dL (ref 8.6–10.2)
Chloride: 103 mmol/L (ref 98–110)
Creat: 0.85 mg/dL (ref 0.50–1.10)
GFR, Est African American: 95 mL/min/{1.73_m2} (ref >=60)
GFR, Est Non African American: 82 mL/min/{1.73_m2} (ref >=60)
Glucose, Bld: 100 mg/dL — ABNORMAL HIGH (ref 65–99)
Potassium: 5.3 mmol/L (ref 3.5–5.3)
Sodium: 137 mmol/L (ref 135–146)

## 2017-09-30 LAB — VITAMIN D 25 HYDROXY (VIT D DEFICIENCY, FRACTURES): Vit D, 25-Hydroxy: 50 ng/mL (ref 30–100)

## 2017-10-04 ENCOUNTER — Ambulatory Visit (HOSPITAL_COMMUNITY)
Admission: RE | Admit: 2017-10-04 | Discharge: 2017-10-04 | Disposition: A | Discharge status: Home / Self Care | Payer: BLUE CROSS/BLUE SHIELD | Source: Ambulatory Visit | Attending: Physician Assistant | Admitting: Physician Assistant

## 2017-10-04 ENCOUNTER — Encounter (HOSPITAL_COMMUNITY): Payer: Self-pay

## 2017-10-04 DIAGNOSIS — Z1231 Encounter for screening mammogram for malignant neoplasm of breast: Secondary | ICD-10-CM | POA: Insufficient documentation

## 2017-10-04 LAB — HM MAMMOGRAPHY

## 2017-10-08 ENCOUNTER — Other Ambulatory Visit: Payer: Self-pay | Admitting: Physician Assistant

## 2017-10-08 DIAGNOSIS — K5909 Other constipation: Secondary | ICD-10-CM

## 2018-03-31 ENCOUNTER — Other Ambulatory Visit: Payer: Self-pay

## 2018-03-31 ENCOUNTER — Ambulatory Visit: Payer: BLUE CROSS/BLUE SHIELD | Admitting: Family Medicine

## 2018-03-31 ENCOUNTER — Ambulatory Visit: Payer: BLUE CROSS/BLUE SHIELD | Admitting: Physician Assistant

## 2018-03-31 ENCOUNTER — Encounter: Payer: Self-pay | Admitting: Family Medicine

## 2018-03-31 VITALS — BP 124/72 | HR 62 | Temp 98.2°F | Resp 14 | Ht 60.0 in | Wt 225.0 lb

## 2018-03-31 DIAGNOSIS — R8290 Unspecified abnormal findings in urine: Secondary | ICD-10-CM

## 2018-03-31 DIAGNOSIS — I10 Essential (primary) hypertension: Secondary | ICD-10-CM

## 2018-03-31 DIAGNOSIS — K5909 Other constipation: Secondary | ICD-10-CM

## 2018-03-31 DIAGNOSIS — E559 Vitamin D deficiency, unspecified: Secondary | ICD-10-CM

## 2018-03-31 LAB — COMPLETE METABOLIC PANEL WITH GFR
AG Ratio: 1.4 (calc) (ref 1.0–2.5)
ALT: 32 U/L — ABNORMAL HIGH (ref 6–29)
AST: 15 U/L (ref 10–35)
Albumin: 4.2 g/dL (ref 3.6–5.1)
Alkaline phosphatase (APISO): 48 U/L (ref 33–115)
BUN: 16 mg/dL (ref 7–25)
CO2: 25 mmol/L (ref 20–32)
Calcium: 10 mg/dL (ref 8.6–10.2)
Chloride: 100 mmol/L (ref 98–110)
Creat: 0.76 mg/dL (ref 0.50–1.10)
GFR, Est African American: 109 mL/min/{1.73_m2} (ref >=60)
GFR, Est Non African American: 94 mL/min/{1.73_m2} (ref >=60)
Globulin: 2.9 g/dL (calc) (ref 1.9–3.7)
Glucose, Bld: 121 mg/dL — ABNORMAL HIGH (ref 65–99)
Potassium: 4.9 mmol/L (ref 3.5–5.3)
Sodium: 134 mmol/L — ABNORMAL LOW (ref 135–146)
Total Bilirubin: 0.3 mg/dL (ref 0.2–1.2)
Total Protein: 7.1 g/dL (ref 6.1–8.1)

## 2018-03-31 LAB — URINALYSIS, ROUTINE W REFLEX MICROSCOPIC
Bacteria, UA: NONE SEEN /HPF
Bilirubin Urine: NEGATIVE
Glucose, UA: NEGATIVE
Ketones, ur: NEGATIVE
Leukocytes, UA: NEGATIVE
Nitrite: NEGATIVE
Protein, ur: NEGATIVE
Specific Gravity, Urine: 1.02 (ref 1.001–1.03)
WBC, UA: NONE SEEN /HPF (ref 0–5)
pH: 6.5 (ref 5.0–8.0)

## 2018-03-31 LAB — MICROSCOPIC MESSAGE

## 2018-03-31 NOTE — Patient Instructions (Signed)
Continue lisinopril - I will send you lab results  Continue with healthy diet, low salt and exercise  Please let us know if your abdomen or nasal symptoms change or worsen.

## 2018-03-31 NOTE — Progress Notes (Signed)
Patient ID: Christina Arroyo, female    DOB: April 16, 1971, 47 y.o.   MRN: [id number]  PCP: Christina Grana, PA-C  Chief Complaint  Patient presents with  . Follow-up    is fasting  . Abnormal Odor to Urine    x1-2 weeks    Subjective:   Christina Arroyo is a 47 y.o. female, presents to clinic with CC of follow-up for blood pressure, constipation and vitamin D deficiency.  She also has a acute urinary complaints stating that for 1 to 2 weeks has had darker urine with an odor with a history of UTIs but she has had no urinary frequency urgency or hematuria.  Blood pressure is doing well with 20 mg of lisinopril, she does check her blood pressures occasionally is usually systolic in the 242P, she does not notice any side effects from medication, she denies chest pain, shortness of breath, lower extremity edema.  Chronic constipation, she does use Linzess the lower dose of 72 mcg not every day she states it but as needed for when she feels extremely backed up.  She is due for repeat labs.  Is still taking vitamin D supplementation.    She refuses flu vaccination here today.    Patient Active Problem List   Diagnosis Date Noted  . Uterine fibroid 03/30/2016  . Family hx of colon cancer   . Chronic constipation 05/15/2015  . Vitamin D deficiency 11/30/2014  . Family history of colon cancer 11/22/2014  . Hypertension     Current Meds  Medication Sig  . Cholecalciferol 2000 units CAPS Take 1 capsule (2,000 Units total) daily by mouth.  . fluticasone (FLONASE) 50 MCG/ACT nasal spray Place 2 sprays into both nostrils daily.  Marland Kitchen LINZESS 72 MCG capsule TAKE (1) CAPSULE BY MOUTH EVERY DAY.TAKE EACH MORNING BEFORE EATING/DRINKING.  Marland Kitchen lisinopril (PRINIVIL,ZESTRIL) 20 MG tablet TAKE ONE TABLET BY MOUTH DAILY.     Review of Systems  Constitutional: Negative.   HENT: Negative.   Eyes: Negative.   Respiratory: Negative.   Cardiovascular: Negative.   Gastrointestinal: Negative.   Endocrine:  Negative.   Genitourinary: Negative.   Musculoskeletal: Negative.   Skin: Negative.   Allergic/Immunologic: Negative.   Neurological: Negative.   Hematological: Negative.   Psychiatric/Behavioral: Negative.   All other systems reviewed and are negative.      Objective:    Vitals:   03/31/18 0809  BP: 124/72  Pulse: 62  Resp: 14  Temp: 98.2 F (36.8 C)  TempSrc: Oral  SpO2: 98%  Weight: 225 lb (102.1 kg)  Height: 5' (1.524 m)      Physical Exam  Constitutional: She is oriented to person, place, and time. She appears well-developed and well-nourished.  Non-toxic appearance. No distress.  HENT:  Head: Normocephalic and atraumatic.  Right Ear: External ear normal.  Left Ear: External ear normal.  Nose: Nose normal.  Mouth/Throat: Uvula is midline, oropharynx is clear and moist and mucous membranes are normal.  Eyes: Pupils are equal, round, and reactive to light. Conjunctivae, EOM and lids are normal. No scleral icterus.  Neck: Normal range of motion and phonation normal. Neck supple. No tracheal deviation present.  Cardiovascular: Normal rate, regular rhythm, normal heart sounds and normal pulses. Exam reveals no gallop and no friction rub.  No murmur heard. Pulses:      Radial pulses are 2+ on the right side, and 2+ on the left side.       Posterior tibial pulses are 2+ on  the right side, and 2+ on the left side.  Pulmonary/Chest: Effort normal and breath sounds normal. No stridor. No respiratory distress. She has no wheezes. She has no rhonchi. She has no rales. She exhibits no tenderness.  Abdominal: Soft. Normal appearance and bowel sounds are normal. She exhibits no distension and no mass. There is no tenderness. There is no rebound and no guarding.  No cva tenderness  Musculoskeletal: Normal range of motion. She exhibits no edema or deformity.  Lymphadenopathy:    She has no cervical adenopathy.  Neurological: She is alert and oriented to person, place, and time.  She exhibits normal muscle tone. Gait normal.  Skin: Skin is warm, dry and intact. Capillary refill takes less than 2 seconds. No rash noted. She is not diaphoretic. No pallor.  Psychiatric: She has a normal mood and affect. Her speech is normal and behavior is normal.  Nursing note and vitals reviewed.    Results for orders placed or performed in visit on 03/31/18  Urinalysis, Routine w reflex microscopic  Result Value Ref Range   Color, Urine YELLOW YELLOW   APPearance CLEAR CLEAR   Specific Gravity, Urine 1.020 1.001 - 1.03   pH 6.5 5.0 - 8.0   Glucose, UA NEGATIVE NEGATIVE   Bilirubin Urine NEGATIVE NEGATIVE   Ketones, ur NEGATIVE NEGATIVE   Hgb urine dipstick TRACE (A) NEGATIVE   Protein, ur NEGATIVE NEGATIVE   Nitrite NEGATIVE NEGATIVE   Leukocytes, UA NEGATIVE NEGATIVE   WBC, UA NONE SEEN 0 - 5 /HPF   RBC / HPF 0-2 0 - 2 /HPF   Squamous Epithelial / LPF 0-5 < OR = 5 /HPF   Bacteria, UA NONE SEEN NONE SEEN /HPF       Assessment & Plan:   Christina Arroyo is a 47 y.o. AAF, here for routine follow up on chronic conditions   Problem List Items Addressed This Visit      Cardiovascular and Mediastinum   Hypertension    Well controlled, at goal, no SE with med changes Check renal function Con't meds  F/up 6 months      Relevant Orders   COMPLETE METABOLIC PANEL WITH GFR (Completed)     Digestive   Chronic constipation    Continued sx but linzess is still helping, uses a few times a week con't Return for OV if any worsening, SE or concerns        Other   Vitamin D deficiency    Continuing to supplement at 2000/d Labs done in May Recheck May 2020       Other Visit Diagnoses    Abnormal urine odor    -  Primary   Relevant Orders   Urinalysis, Routine w reflex microscopic (Completed)      Urinalysis showed trace blood but microscopy was unremarkable, no dysuria or urinary frequency, do not feel it needs to be treated with antibiotics  Patient's labs were  drawn to recheck her kidney function, she refused flu today, she is follow-up in 6 months/when due for CPE    Christina Grana, PA-C 03/31/18 8:15 AM

## 2018-04-07 NOTE — Assessment & Plan Note (Signed)
Continuing to supplement at 2000/d Labs done in May Recheck May 2020

## 2018-04-07 NOTE — Assessment & Plan Note (Signed)
Continued sx but linzess is still helping, uses a few times a week con't Return for OV if any worsening, SE or concerns

## 2018-04-07 NOTE — Assessment & Plan Note (Signed)
Well controlled, at goal, no SE with med changes Check renal function Con't meds  F/up 6 months

## 2018-06-02 ENCOUNTER — Telehealth: Payer: Self-pay | Admitting: Family Medicine

## 2018-06-02 DIAGNOSIS — I10 Essential (primary) hypertension: Secondary | ICD-10-CM

## 2018-06-02 MED ORDER — LISINOPRIL 20 MG PO TABS: 20.0000 mg | ORAL_TABLET | Freq: Every day | ORAL | 0 refills | Status: DC

## 2018-06-02 NOTE — Telephone Encounter (Signed)
Lisinopril to [facility] has app scheduled coming up with leisa

## 2018-06-02 NOTE — Telephone Encounter (Signed)
Medication sent into pharmacy. Patient notified.  

## 2018-07-21 ENCOUNTER — Encounter: Payer: Self-pay | Admitting: Family Medicine

## 2018-07-21 ENCOUNTER — Ambulatory Visit: Payer: BLUE CROSS/BLUE SHIELD | Admitting: Family Medicine

## 2018-07-21 VITALS — BP 120/90 | HR 95 | Temp 98.5°F | Resp 15 | Ht 60.0 in | Wt 217.5 lb

## 2018-07-21 DIAGNOSIS — R82998 Other abnormal findings in urine: Secondary | ICD-10-CM

## 2018-07-21 DIAGNOSIS — Z87898 Personal history of other specified conditions: Secondary | ICD-10-CM

## 2018-07-21 DIAGNOSIS — M545 Low back pain, unspecified: Secondary | ICD-10-CM

## 2018-07-21 DIAGNOSIS — M546 Pain in thoracic spine: Secondary | ICD-10-CM | POA: Diagnosis not present

## 2018-07-21 DIAGNOSIS — R109 Unspecified abdominal pain: Secondary | ICD-10-CM | POA: Diagnosis not present

## 2018-07-21 LAB — URINALYSIS, ROUTINE W REFLEX MICROSCOPIC
Bacteria, UA: NONE SEEN /HPF
Bilirubin Urine: NEGATIVE
Glucose, UA: NEGATIVE
Ketones, ur: NEGATIVE
Leukocytes,Ua: NEGATIVE
Nitrite: NEGATIVE
Protein, ur: NEGATIVE
Specific Gravity, Urine: 1.025 (ref 1.001–1.03)
WBC, UA: NONE SEEN /HPF (ref 0–5)
pH: 7 (ref 5.0–8.0)

## 2018-07-21 LAB — MICROSCOPIC MESSAGE

## 2018-07-21 MED ORDER — NAPROXEN 500 MG PO TABS: 500.0000 mg | ORAL_TABLET | Freq: Two times a day (BID) | ORAL | 0 refills | Status: DC

## 2018-07-21 MED ORDER — CYCLOBENZAPRINE HCL 5 MG PO TABS: 5.0000 mg | ORAL_TABLET | Freq: Three times a day (TID) | ORAL | 1 refills | Status: DC | PRN

## 2018-07-21 NOTE — Patient Instructions (Signed)
Low Back Sprain  A sprain is a stretch or tear in the bands of tissue that hold bones and joints together (ligaments). Sprains of the lower back (lumbar spine) are a common cause of low back pain. A sprain occurs when ligaments are overextended or stretched beyond their limits. The ligaments can become inflamed, resulting in pain and sudden muscle tightening (spasms). A sprain can be caused by an injury (trauma), or it can develop gradually due to overuse. There are three types of sprains:  Grade 1 is a mild sprain involving an overstretched ligament or a very slight tear of the ligament.  Grade 2 is a moderate sprain involving a partial tear of the ligament.  Grade 3 is a severe sprain involving a complete tear of the ligament. What are the causes? This condition may be caused by:  Trauma, such as a fall or a hit to the body.  Twisting or overstretching the back. This may result from doing activities that require a lot of energy, such as lifting heavy objects. What increases the risk? The following factors may increase your risk of getting this condition:  Playing contact sports.  Participating in sports or activities that put excessive stress on the back and require a lot of bending and twisting, including: ? Lifting weights or heavy objects. ? Gymnastics. ? Soccer. ? Figure skating. ? Snowboarding.  Being overweight or obese.  Having poor strength and flexibility. What are the signs or symptoms? Symptoms of this condition may include:  Sharp or dull pain in the lower back that does not go away. Pain may extend to the buttocks.  Stiffness.  Limited range of motion.  Inability to stand up straight due to stiffness or pain.  Muscle spasms. How is this diagnosed? This condition may be diagnosed based on:  Your symptoms.  Your medical history.  A physical exam. ? Your health care provider may push on certain areas of your back to determine the source of your pain.  ? You may be asked to bend forward, backward, and side to side to assess the severity of your pain and your range of motion.  Imaging tests, such as: ? X-rays. ? MRI. How is this treated? Treatment for this condition may include:  Applying heat and cold to the affected area.  Medicines to help relieve pain and to relax your muscles (muscle relaxants).  NSAIDs to help reduce swelling and discomfort.  Physical therapy. When your symptoms improve, it is important to gradually return to your normal routine as soon as possible to reduce pain, avoid stiffness, and avoid loss of muscle strength. Generally, symptoms should improve within 6 weeks of treatment. However, recovery time varies. Follow these instructions at home: Managing pain, stiffness, and swelling   If directed, apply ice to the injured area during the first 24 hours after your injury. ? Put ice in a plastic bag. ? Place a towel between your skin and the bag. ? Leave the ice on for 20 minutes, 2-3 times a day.  If directed, apply heat to the affected area as often as told by your health care provider. Use the heat source that your health care provider recommends, such as a moist heat pack or a heating pad. ? Place a towel between your skin and the heat source. ? Leave the heat on for 20-30 minutes. ? Remove the heat if your skin turns bright red. This is especially important if you are unable to feel pain, heat, or cold. You may   have a greater risk of getting burned. Activity  Rest and return to your normal activities as told by your health care provider. Ask your health care provider what activities are safe for you.  Avoid activities that take a lot of effort (are strenuous) for as long as told by your health care provider.  Do exercises as told by your health care provider. General instructions   Take over-the-counter and prescription medicines only as told by your health care provider.  If you have questions or  concerns about safety while taking pain medicine, talk with your health care provider.  Do not drive or operate heavy machinery until you know how your pain medicine affects you.  Do not use any tobacco products, such as cigarettes, chewing tobacco, and e-cigarettes. Tobacco can delay bone healing. If you need help quitting, ask your health care provider.  Keep all follow-up visits as told by your health care provider. This is important. How is this prevented?  Warm up and stretch before being active.  Cool down and stretch after being active.  Give your body time to rest between periods of activity.  Avoid: ? Being physically inactive for long periods at a time. ? Exercising or playing sports when you are tired or in pain.  Use correct form when playing sports and lifting heavy objects.  Use good posture when sitting and standing.  Maintain a healthy weight.  Sleep on a mattress with medium firmness to support your back.  Make sure to use equipment that fits you, including shoes that fit well.  Be safe and responsible while being active to avoid falls.  Do at least 150 minutes of moderate-intensity exercise each week, such as brisk walking or water aerobics. Try a form of exercise that takes stress off your back, such as swimming or stationary cycling.  Maintain physical fitness, including: ? Strength. In particular, develop and maintain strong abdominal muscles. ? Flexibility. ? Cardiovascular fitness. ? Endurance. Contact a health care provider if:  Your back pain does not improve after 6 weeks of treatment.  Your symptoms get worse. Get help right away if:  Your back pain is severe.  You are unable to stand or walk.  You develop pain in your legs.  You develop weakness in your buttocks or legs.  You have difficulty controlling when you urinate or when you have a bowel movement. This information is not intended to replace advice given to you by your health  care provider. Make sure you discuss any questions you have with your health care provider. Document Released: 05/04/2005 Document Revised: 01/09/2016 Document Reviewed: 02/13/2015 Elsevier Interactive Patient Education  2019 [employer]  

## 2018-07-21 NOTE — Progress Notes (Signed)
Patient ID: Christina Arroyo, female    DOB: Apr 24, 1971, 48 y.o.   MRN: [id number]  PCP: Delsa Grana, PA-C   Chief Complaint  Patient presents with  . Urinary Tract Infection    In with c/o low back pain, foul smelling urine. Onset a few days ago    Subjective:   Christina Arroyo is a 48 y.o. female, presents to clinic with CC of dark urine with odor with low back ache for a few days, pain relative to urinary sx 0/10, pain to low back 8/10 Urinary Tract Infection   This is a recurrent problem. The current episode started in the past 7 days. The problem occurs intermittently. The problem has been unchanged. The quality of the pain is described as aching and burning. There has been no fever. She is not sexually active. There is no history of pyelonephritis. Pertinent negatives include no chills, discharge, flank pain, frequency, hematuria, hesitancy, nausea, sweats, urgency or vomiting. Associated symptoms comments: Very low back pain. She has tried increased fluids for the symptoms. The treatment provided no relief. Her past medical history is significant for recurrent UTIs. There is no history of catheterization, kidney stones, a single kidney or urinary stasis.  Back Pain  This is a new problem. The current episode started 1 to 4 weeks ago. The problem occurs intermittently. The problem has been waxing and waning since onset. The pain is present in the lumbar spine and thoracic spine. The quality of the pain is described as cramping and aching. The pain is at a severity of 8/10. Associated symptoms include dysuria. Pertinent negatives include no abdominal pain, headaches, numbness or weakness. Risk factors include history of cancer, recent trauma, lack of exercise and obesity. She has tried NSAIDs and heat for the symptoms. The treatment provided mild relief.      Patient Active Problem List   Diagnosis Date Noted  . Uterine fibroid 03/30/2016  . Family hx of colon cancer   . Chronic  constipation 05/15/2015  . Vitamin D deficiency 11/30/2014  . Family history of colon cancer 11/22/2014  . Hypertension      Prior to Admission medications   Medication Sig Start Date End Date Taking? Authorizing Provider  Cholecalciferol 2000 units CAPS Take 1 capsule (2,000 Units total) daily by mouth. 04/02/17  Yes Dixon, Mary B, PA-C  LINZESS 72 MCG capsule TAKE (1) CAPSULE BY MOUTH EVERY DAY.TAKE EACH MORNING BEFORE EATING/DRINKING. 10/08/17  Yes Dena Billet B, PA-C  lisinopril (PRINIVIL,ZESTRIL) 20 MG tablet Take 1 tablet (20 mg total) by mouth daily. 06/02/18  Yes Delsa Grana, PA-C  fluticasone (FLONASE) 50 MCG/ACT nasal spray Place 2 sprays into both nostrils daily. Patient not taking: Reported on 07/21/2018 09/28/16   Dena Billet B, PA-C     No Known Allergies   Family History  Problem Relation Age of Onset  . Hypertension Father   . Cancer Father 55       colon? and prostate  . Pulmonary embolism Mother   . Cancer Paternal Grandfather      Social History   Socioeconomic History  . Marital status: Married    Spouse name: Not on file  . Number of children: Not on file  . Years of education: Not on file  . Highest education level: Not on file  Occupational History  . Not on file  Social Needs  . Financial resource strain: Not on file  . Food insecurity:    Worry: Not on file  Inability: Not on file  . Transportation needs:    Medical: Not on file    Non-medical: Not on file  Tobacco Use  . Smoking status: Never Smoker  . Smokeless tobacco: Never Used  Substance and Sexual Activity  . Alcohol use: No  . Drug use: No  . Sexual activity: Yes    Birth control/protection: Surgical  Lifestyle  . Physical activity:    Days per week: Not on file    Minutes per session: Not on file  . Stress: Not on file  Relationships  . Social connections:    Talks on phone: Not on file    Gets together: Not on file    Attends religious service: Not on file    Active  member of club or organization: Not on file    Attends meetings of clubs or organizations: Not on file    Relationship status: Not on file  . Intimate partner violence:    Fear of current or ex partner: Not on file    Emotionally abused: Not on file    Physically abused: Not on file    Forced sexual activity: Not on file  Other Topics Concern  . Not on file  Social History Narrative   Entered 11/2014:    Married. 3 children--Ages 17,19,20 y/o.   ----62 y/o still at home--should graduate HS upcoming year.   --Other 2 are in colleg--1 at [employer]. 1 at [employer].       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       Review of Systems  Constitutional: Negative.  Negative for activity change, appetite change, chills, diaphoresis and fatigue.  HENT: Negative.   Eyes: Negative.   Respiratory: Negative.   Cardiovascular: Negative.   Gastrointestinal: Negative.  Negative for abdominal pain, diarrhea, nausea and vomiting.  Endocrine: Negative.  Negative for polydipsia, polyphagia and polyuria.  Genitourinary: Positive for dysuria. Negative for decreased urine volume, difficulty urinating, dyspareunia, flank pain, frequency, hematuria, hesitancy, urgency, vaginal bleeding, vaginal discharge and vaginal pain.  Musculoskeletal: Positive for back pain. Negative for arthralgias and myalgias.  Skin: Negative.   Allergic/Immunologic: Negative.   Neurological: Negative.  Negative for dizziness, weakness, numbness and headaches.  Hematological: Negative.   Psychiatric/Behavioral: Negative.   All other systems reviewed and are negative.      Objective:    Vitals:   07/21/18 0827  BP: 120/90  Pulse: 95  Resp: 15  Temp: 98.5 F (36.9 C)  TempSrc: Oral  SpO2: 95%  Weight: 217 lb 8 oz (98.7 kg)  Height: 5' (1.524 m)      Physical Exam Vitals signs and nursing note reviewed.  Constitutional:      General: She is not in acute distress.     Appearance: Normal appearance. She is well-developed. She is obese. She is not ill-appearing, toxic-appearing or diaphoretic.  HENT:     Head: Normocephalic and atraumatic.     Right Ear: External ear normal.     Left Ear: External ear normal.     Nose: Nose normal.     Mouth/Throat:     Mouth: Mucous membranes are moist.     Pharynx: Oropharynx is clear. Uvula midline.  Eyes:     General: Lids are normal.        Right eye: No discharge.        Left eye: No discharge.     Conjunctiva/sclera: Conjunctivae normal.  Neck:  Musculoskeletal: Normal range of motion and neck supple.     Trachea: Phonation normal. No tracheal deviation.  Cardiovascular:     Rate and Rhythm: Normal rate and regular rhythm.     Pulses: Normal pulses.          Radial pulses are 2+ on the right side and 2+ on the left side.       Posterior tibial pulses are 2+ on the right side and 2+ on the left side.     Heart sounds: Normal heart sounds. No murmur. No friction rub. No gallop.   Pulmonary:     Effort: Pulmonary effort is normal. No respiratory distress.     Breath sounds: Normal breath sounds. No stridor. No wheezing, rhonchi or rales.  Chest:     Chest wall: No tenderness.  Abdominal:     General: Bowel sounds are normal. There is no distension.     Palpations: Abdomen is soft.     Tenderness: There is no abdominal tenderness. There is no right CVA tenderness, left CVA tenderness, guarding or rebound.     Comments: Obese, soft  Musculoskeletal: Normal range of motion.        General: No deformity.     Cervical back: Normal. She exhibits normal range of motion, no tenderness and no bony tenderness.     Thoracic back: She exhibits normal range of motion, no tenderness, no bony tenderness, no swelling, no edema, no pain and no spasm.     Lumbar back: Normal. She exhibits normal range of motion, no tenderness and no bony tenderness.     Comments: Negative SLR b/l Grossly normal sensation to light touch  b/l to LE 5/5 strength b/l with dorsiflexion and plantar flexion Normal gait  Lymphadenopathy:     Cervical: No cervical adenopathy.  Skin:    General: Skin is warm and dry.     Capillary Refill: Capillary refill takes less than 2 seconds.     Coloration: Skin is not pale.     Findings: No rash.  Neurological:     Mental Status: She is alert and oriented to person, place, and time.     Motor: No abnormal muscle tone.     Gait: Gait normal.  Psychiatric:        Mood and Affect: Mood normal.        Speech: Speech normal.        Behavior: Behavior normal.           Assessment & Plan:      ICD-10-CM   1. Dark urine R82.998 Urinalysis, Routine w reflex microscopic    Urine Culture    Microscopic Message   abd exam benign, no CVA tenderness, culture added but would like recollect urine sample  2. History of dysuria Z87.898 Urinalysis, Routine w reflex microscopic    Urine Culture    Microscopic Message   Odor and darker in color, no dysuria no frequency or urgency - contaminated sample, culture pending, pt asked to push fluids and return to leave clean catch  3. Acute low back pain without sciatica, unspecified back pain laterality M54.5 cyclobenzaprine (FLEXERIL) 5 MG tablet    naproxen (NAPROSYN) 500 MG tablet    Ambulatory referral to Physical Therapy  4. Acute left-sided thoracic back pain M54.6 cyclobenzaprine (FLEXERIL) 5 MG tablet    naproxen (NAPROSYN) 500 MG tablet    Ambulatory referral to Physical Therapy       Delsa Grana, PA-C 07/21/18 8:36 AM

## 2018-07-23 LAB — URINE CULTURE
MICRO NUMBER:: 281456
SPECIMEN QUALITY:: ADEQUATE

## 2018-07-26 ENCOUNTER — Other Ambulatory Visit: Payer: Self-pay | Admitting: Family Medicine

## 2018-07-26 MED ORDER — SULFAMETHOXAZOLE-TRIMETHOPRIM 800-160 MG PO TABS: 1.0000 | ORAL_TABLET | Freq: Two times a day (BID) | ORAL | 0 refills | Status: AC

## 2018-07-26 MED ORDER — FLUCONAZOLE 150 MG PO TABS: 150.0000 mg | ORAL_TABLET | Freq: Once | ORAL | 0 refills | Status: AC

## 2018-07-29 ENCOUNTER — Other Ambulatory Visit: Payer: Self-pay | Admitting: Family Medicine

## 2018-07-29 DIAGNOSIS — R82998 Other abnormal findings in urine: Secondary | ICD-10-CM

## 2018-08-05 ENCOUNTER — Other Ambulatory Visit: Payer: Self-pay

## 2018-08-05 ENCOUNTER — Other Ambulatory Visit: Payer: BLUE CROSS/BLUE SHIELD

## 2018-08-05 DIAGNOSIS — R82998 Other abnormal findings in urine: Secondary | ICD-10-CM | POA: Diagnosis not present

## 2018-08-05 LAB — URINALYSIS, ROUTINE W REFLEX MICROSCOPIC
Bacteria, UA: NONE SEEN /HPF
Bilirubin Urine: NEGATIVE
Glucose, UA: NEGATIVE
Hyaline Cast: NONE SEEN /LPF
Ketones, ur: NEGATIVE
Leukocytes,Ua: NEGATIVE
Nitrite: NEGATIVE
Protein, ur: NEGATIVE
Specific Gravity, Urine: 1.026 (ref 1.001–1.03)
pH: 7 (ref 5.0–8.0)

## 2018-08-05 LAB — MICROSCOPIC MESSAGE

## 2018-09-21 ENCOUNTER — Other Ambulatory Visit: Payer: Self-pay | Admitting: Family Medicine

## 2018-09-21 DIAGNOSIS — I10 Essential (primary) hypertension: Secondary | ICD-10-CM

## 2018-09-23 ENCOUNTER — Telehealth: Payer: Self-pay | Admitting: Family Medicine

## 2018-09-23 ENCOUNTER — Other Ambulatory Visit: Payer: Self-pay

## 2018-09-23 DIAGNOSIS — K5909 Other constipation: Secondary | ICD-10-CM

## 2018-09-23 MED ORDER — LINACLOTIDE 72 MCG PO CAPS: 72.0000 ug | ORAL_CAPSULE | Freq: Every day | ORAL | 0 refills | Status: DC

## 2018-09-23 NOTE — Telephone Encounter (Signed)
Pt needs refill on linzess [employer]

## 2018-09-23 NOTE — Telephone Encounter (Signed)
Done. Thank you.

## 2018-09-29 ENCOUNTER — Other Ambulatory Visit (HOSPITAL_COMMUNITY): Payer: Self-pay | Admitting: Physician Assistant

## 2018-09-29 ENCOUNTER — Other Ambulatory Visit (HOSPITAL_COMMUNITY): Payer: Self-pay | Admitting: Family Medicine

## 2018-09-29 ENCOUNTER — Encounter: Payer: BLUE CROSS/BLUE SHIELD | Admitting: Family Medicine

## 2018-09-29 ENCOUNTER — Telehealth: Payer: Self-pay

## 2018-09-29 DIAGNOSIS — Z1231 Encounter for screening mammogram for malignant neoplasm of breast: Secondary | ICD-10-CM

## 2018-09-29 NOTE — Telephone Encounter (Signed)
Pt's insurance no longer covers Linzess. Pharmacy wants to know if you want to switch to Trulance or Symproic. Please advise.

## 2018-09-29 NOTE — Telephone Encounter (Signed)
Pt called wanted to know about the change to her Linzess. States she only has 4 left.  See note:

## 2018-09-30 ENCOUNTER — Other Ambulatory Visit: Payer: Self-pay

## 2018-09-30 ENCOUNTER — Other Ambulatory Visit: Payer: BLUE CROSS/BLUE SHIELD

## 2018-09-30 DIAGNOSIS — I10 Essential (primary) hypertension: Secondary | ICD-10-CM | POA: Diagnosis not present

## 2018-09-30 MED ORDER — PLECANATIDE 3 MG PO TABS: 3.0000 mg | ORAL_TABLET | Freq: Every day | ORAL | 2 refills | Status: DC | PRN

## 2018-09-30 NOTE — Addendum Note (Signed)
Addended by: Delsa Grana on: 09/30/2018 10:08 AM   Modules accepted: Orders

## 2018-09-30 NOTE — Addendum Note (Signed)
Addended by: Delsa Grana on: 09/30/2018 10:04 AM   Modules accepted: Orders

## 2018-09-30 NOTE — Telephone Encounter (Signed)
Pt notified Verbalizes understanding 

## 2018-10-01 LAB — CBC WITH DIFFERENTIAL/PLATELET
Absolute Monocytes: 478 cells/uL (ref 200–950)
Basophils Absolute: 41 cells/uL (ref 0–200)
Basophils Relative: 0.7 %
Eosinophils Absolute: 189 cells/uL (ref 15–500)
Eosinophils Relative: 3.2 %
HCT: 40.3 % (ref 35.0–45.0)
Hemoglobin: 13.4 g/dL (ref 11.7–15.5)
Lymphs Abs: 1988 cells/uL (ref 850–3900)
MCH: 29.3 pg (ref 27.0–33.0)
MCHC: 33.3 g/dL (ref 32.0–36.0)
MCV: 88.2 fL (ref 80.0–100.0)
MPV: 10 fL (ref 7.5–12.5)
Monocytes Relative: 8.1 %
Neutro Abs: 3204 cells/uL (ref 1500–7800)
Neutrophils Relative %: 54.3 %
Platelets: 367 10*3/uL (ref 140–400)
RBC: 4.57 10*6/uL (ref 3.80–5.10)
RDW: 12.5 % (ref 11.0–15.0)
Total Lymphocyte: 33.7 %
WBC: 5.9 10*3/uL (ref 3.8–10.8)

## 2018-10-01 LAB — COMPREHENSIVE METABOLIC PANEL
AG Ratio: 1.5 (calc) (ref 1.0–2.5)
ALT: 27 U/L (ref 6–29)
AST: 14 U/L (ref 10–35)
Albumin: 4.1 g/dL (ref 3.6–5.1)
Alkaline phosphatase (APISO): 43 U/L (ref 31–125)
BUN: 14 mg/dL (ref 7–25)
CO2: 26 mmol/L (ref 20–32)
Calcium: 9.6 mg/dL (ref 8.6–10.2)
Chloride: 103 mmol/L (ref 98–110)
Creat: 0.76 mg/dL (ref 0.50–1.10)
Globulin: 2.8 g/dL (calc) (ref 1.9–3.7)
Glucose, Bld: 130 mg/dL — ABNORMAL HIGH (ref 65–99)
Potassium: 5.1 mmol/L (ref 3.5–5.3)
Sodium: 137 mmol/L (ref 135–146)
Total Bilirubin: 0.2 mg/dL (ref 0.2–1.2)
Total Protein: 6.9 g/dL (ref 6.1–8.1)

## 2018-10-01 LAB — LIPID PANEL
Cholesterol: 162 mg/dL (ref <200)
HDL: 58 mg/dL (ref >=50)
LDL Cholesterol (Calc): 91 mg/dL (calc)
Non-HDL Cholesterol (Calc): 104 mg/dL (calc) (ref <130)
Total CHOL/HDL Ratio: 2.8 (calc) (ref <5.0)
Triglycerides: 44 mg/dL (ref <150)

## 2018-10-04 ENCOUNTER — Encounter: Payer: Self-pay | Admitting: Family Medicine

## 2018-10-04 NOTE — Progress Notes (Signed)
Printing labs for f/up visit Needs A1C

## 2018-10-06 ENCOUNTER — Ambulatory Visit: Payer: BLUE CROSS/BLUE SHIELD | Admitting: Family Medicine

## 2018-10-06 ENCOUNTER — Other Ambulatory Visit: Payer: Self-pay

## 2018-10-06 ENCOUNTER — Encounter: Payer: Self-pay | Admitting: Family Medicine

## 2018-10-06 VITALS — BP 126/88 | HR 78 | Temp 98.7°F | Resp 16 | Ht 60.0 in | Wt 219.4 lb

## 2018-10-06 DIAGNOSIS — K5909 Other constipation: Secondary | ICD-10-CM | POA: Diagnosis not present

## 2018-10-06 DIAGNOSIS — I10 Essential (primary) hypertension: Secondary | ICD-10-CM | POA: Diagnosis not present

## 2018-10-06 DIAGNOSIS — R7301 Impaired fasting glucose: Secondary | ICD-10-CM

## 2018-10-06 NOTE — Progress Notes (Signed)
Patient ID: Christina Arroyo, female    DOB: Apr 13, 1971, 48 y.o.   MRN: [id number]  PCP: Delsa Grana, PA-C  Chief Complaint  Patient presents with   Results    follow up and labs    Subjective:   Christina Arroyo is a 48 y.o. female, presents to clinic with CC of abnormal labs -  High fasting blood sugar, htn f/up and constipation f/up - insurance will not cover her meds.    HTN -patient is compliant with taking her lisinopril, she denies any side effects.  She has no chest pain, palpitations, near-syncope, no dry cough or other known or suspected adverse side effects.  She did get her repeat labs done last week, her kidney function was good and within normal limits her electrolytes were also good reviewed her labs with her today.  Blood pressure today is 128/88, she does not monitor at home.   Reviewed blood sugar elevation, she has no hx of dm.   Weight relatively stable, and down from last year a few lbs, but she does have BMI of 42.85, risk for metabolic syndrome, increased CVD risk Wt Readings from Last 5 Encounters:  10/06/18 219 lb 6.4 oz (99.5 kg)  07/21/18 217 lb 8 oz (98.7 kg)  03/31/18 225 lb (102.1 kg)  09/29/17 223 lb 3.2 oz (101.2 kg)  04/01/17 224 lb 9.6 oz (101.9 kg)       Patient Active Problem List   Diagnosis Date Noted   Uterine fibroid 03/30/2016   Family hx of colon cancer    Chronic constipation 05/15/2015   Vitamin D deficiency 11/30/2014   Family history of colon cancer 11/22/2014   Hypertension      Prior to Admission medications   Medication Sig Start Date End Date Taking? Authorizing Provider  Cholecalciferol 2000 units CAPS Take 1 capsule (2,000 Units total) daily by mouth. 04/02/17  Yes Dixon, Mary B, PA-C  cyclobenzaprine (FLEXERIL) 5 MG tablet Take 1 tablet (5 mg total) by mouth 3 (three) times daily as needed for muscle spasms. 07/21/18  Yes Delsa Grana, PA-C  lisinopril (ZESTRIL) 20 MG tablet TAKE ONE TABLET BY MOUTH DAILY. 09/22/18  Yes  Delsa Grana, PA-C  naproxen (NAPROSYN) 500 MG tablet Take 1 tablet (500 mg total) by mouth 2 (two) times daily with a meal. 07/21/18  Yes Delsa Grana, PA-C  linaclotide (LINZESS) 72 MCG capsule Take 1 capsule (72 mcg total) by mouth daily before breakfast. Patient not taking: Reported on 10/06/2018 09/23/18   Delsa Grana, PA-C  Plecanatide (TRULANCE) 3 MG TABS Take 3 mg by mouth daily as needed (for constipation). Patient not taking: Reported on 10/06/2018 09/30/18   Delsa Grana, PA-C   meds updated  No Known Allergies   Family History  Problem Relation Age of Onset   Hypertension Father    Cancer Father 99       colon? and prostate   Pulmonary embolism Mother    Cancer Paternal Grandfather      Social History   Socioeconomic History   Marital status: Married    Spouse name: Not on file   Number of children: Not on file   Years of education: Not on file   Highest education level: Not on file  Occupational History   Not on file  Social Needs   Financial resource strain: Not on file   Food insecurity:    Worry: Not on file    Inability: Not on file   Transportation  needs:    Medical: Not on file    Non-medical: Not on file  Tobacco Use   Smoking status: Never Smoker   Smokeless tobacco: Never Used  Substance and Sexual Activity   Alcohol use: No   Drug use: No   Sexual activity: Yes    Birth control/protection: Surgical  Lifestyle   Physical activity:    Days per week: Not on file    Minutes per session: Not on file   Stress: Not on file  Relationships   Social connections:    Talks on phone: Not on file    Gets together: Not on file    Attends religious service: Not on file    Active member of club or organization: Not on file    Attends meetings of clubs or organizations: Not on file    Relationship status: Not on file   Intimate partner violence:    Fear of current or ex partner: Not on file    Emotionally abused: Not on file     Physically abused: Not on file    Forced sexual activity: Not on file  Other Topics Concern   Not on file  Social History Narrative   Entered 11/2014:    Married. 3 children--Ages 17,19,20 y/o.   ----29 y/o still at home--should graduate HS upcoming year.   --Other 2 are in colleg--1 at [employer]. 1 at [employer].       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       Review of Systems  Constitutional: Negative.   HENT: Negative.   Eyes: Negative.   Respiratory: Negative.   Cardiovascular: Negative.   Gastrointestinal: Negative.   Endocrine: Negative.   Genitourinary: Negative.   Musculoskeletal: Negative.   Skin: Negative.   Allergic/Immunologic: Negative.   Neurological: Negative.   Hematological: Negative.   Psychiatric/Behavioral: Negative.   All other systems reviewed and are negative.      Objective:    Vitals:   10/06/18 0928  BP: 126/88  Pulse: 78  Resp: 16  Temp: 98.7 F (37.1 C)  SpO2: 97%  Weight: 219 lb 6.4 oz (99.5 kg)  Height: 5' (1.524 m)      Physical Exam Vitals signs and nursing note reviewed.  Constitutional:      General: She is not in acute distress.    Appearance: Normal appearance. She is well-developed. She is not ill-appearing, toxic-appearing or diaphoretic.  HENT:     Head: Normocephalic and atraumatic.     Right Ear: External ear normal.     Left Ear: External ear normal.     Nose: Nose normal. No congestion.     Mouth/Throat:     Mouth: Mucous membranes are moist.     Pharynx: Oropharynx is clear.  Eyes:     General: No scleral icterus.       Right eye: No discharge.        Left eye: No discharge.     Conjunctiva/sclera: Conjunctivae normal.     Pupils: Pupils are equal, round, and reactive to light.  Neck:     Trachea: No tracheal deviation.  Cardiovascular:     Rate and Rhythm: Normal rate and regular rhythm.     Pulses: Normal pulses.     Heart sounds: Normal heart sounds. No  murmur. No friction rub. No gallop.   Pulmonary:     Effort: Pulmonary effort is normal. No respiratory distress.  Breath sounds: Normal breath sounds. No stridor. No wheezing or rhonchi.  Abdominal:     General: Bowel sounds are normal. There is no distension.     Palpations: Abdomen is soft.     Tenderness: There is no abdominal tenderness. There is no right CVA tenderness, left CVA tenderness or guarding.  Musculoskeletal: Normal range of motion.  Skin:    General: Skin is warm and dry.     Capillary Refill: Capillary refill takes less than 2 seconds.     Coloration: Skin is not jaundiced or pale.     Findings: No rash.  Neurological:     Mental Status: She is alert.     Motor: No abnormal muscle tone.     Coordination: Coordination normal.     Gait: Gait normal.  Psychiatric:        Mood and Affect: Mood normal.        Behavior: Behavior normal.        Thought Content: Thought content normal.     Results for orders placed or performed in visit on 09/30/18  Lipid Panel  Result Value Ref Range   Cholesterol 162 <200 mg/dL   HDL 58 > OR = 50 mg/dL   Triglycerides 44 <150 mg/dL   LDL Cholesterol (Calc) 91 mg/dL (calc)   Total CHOL/HDL Ratio 2.8 <5.0 (calc)   Non-HDL Cholesterol (Calc) 104 <130 mg/dL (calc)  Comprehensive metabolic panel  Result Value Ref Range   Glucose, Bld 130 (H) 65 - 99 mg/dL   BUN 14 7 - 25 mg/dL   Creat 0.76 0.50 - 1.10 mg/dL   BUN/Creatinine Ratio NOT APPLICABLE 6 - 22 (calc)   Sodium 137 135 - 146 mmol/L   Potassium 5.1 3.5 - 5.3 mmol/L   Chloride 103 98 - 110 mmol/L   CO2 26 20 - 32 mmol/L   Calcium 9.6 8.6 - 10.2 mg/dL   Total Protein 6.9 6.1 - 8.1 g/dL   Albumin 4.1 3.6 - 5.1 g/dL   Globulin 2.8 1.9 - 3.7 g/dL (calc)   AG Ratio 1.5 1.0 - 2.5 (calc)   Total Bilirubin 0.2 0.2 - 1.2 mg/dL   Alkaline phosphatase (APISO) 43 31 - 125 U/L   AST 14 10 - 35 U/L   ALT 27 6 - 29 U/L  CBC with Differential/Platelet  Result Value Ref Range    WBC 5.9 3.8 - 10.8 Thousand/uL   RBC 4.57 3.80 - 5.10 Million/uL   Hemoglobin 13.4 11.7 - 15.5 g/dL   HCT 40.3 35.0 - 45.0 %   MCV 88.2 80.0 - 100.0 fL   MCH 29.3 27.0 - 33.0 pg   MCHC 33.3 32.0 - 36.0 g/dL   RDW 12.5 11.0 - 15.0 %   Platelets 367 140 - 400 Thousand/uL   MPV 10.0 7.5 - 12.5 fL   Neutro Abs 3,204 1,500 - 7,800 cells/uL   Lymphs Abs 1,988 850 - 3,900 cells/uL   Absolute Monocytes 478 200 - 950 cells/uL   Eosinophils Absolute 189 15 - 500 cells/uL   Basophils Absolute 41 0 - 200 cells/uL   Neutrophils Relative % 54.3 %   Total Lymphocyte 33.7 %   Monocytes Relative 8.1 %   Eosinophils Relative 3.2 %   Basophils Relative 0.7 %         Assessment & Plan:    Problem List Items Addressed This Visit      Cardiovascular and Mediastinum   Hypertension - Primary    Well controlled,  at goal, no SE with med changes Labs done last week, renal function normal Con't meds  F/up 6 months        Digestive   Chronic constipation    Linzess no longer covered by insurance and alternate medication prior authorization was rejected Symptoms mild she is having stools daily, no straining.  Without Linzess she continues to have daily bowel movements that are slightly more firm.  She has now been working on any diet changes, increasing fiber, pushing fluids and has not tried MiraLAX, conservative management reviewed today and MiraLAX samples given We are trying to reach out to pharmaceutical rep to see if they have any samples of low-dose Linzess that we could give Ms. Pekar       Other Visit Diagnoses    Elevated fasting blood sugar       Reviewed her labs she has history of elevated fasting blood sugar will add A1c, possibly prediabetic   Relevant Orders   Hemoglobin A1c       Next f/up in 6 month of when due for CPE, which ever is sooner - needs PAP done - overdue   Delsa Grana, PA-C 10/06/18 6:06 PM

## 2018-10-06 NOTE — Assessment & Plan Note (Signed)
Well controlled, at goal, no SE with med changes Labs done last week, renal function normal Con't meds  F/up 6 months

## 2018-10-06 NOTE — Assessment & Plan Note (Signed)
Linzess no longer covered by insurance and alternate medication prior authorization was rejected Symptoms mild she is having stools daily, no straining.  Without Linzess she continues to have daily bowel movements that are slightly more firm.  She has now been working on any diet changes, increasing fiber, pushing fluids and has not tried MiraLAX, conservative management reviewed today and MiraLAX samples given We are trying to reach out to pharmaceutical rep to see if they have any samples of low-dose Linzess that we could give Ms. Starrett

## 2018-10-07 LAB — HEMOGLOBIN A1C
Hgb A1c MFr Bld: 6.5 % of total Hgb — ABNORMAL HIGH (ref <5.7)
Mean Plasma Glucose: 140 (calc)
eAG (mmol/L): 7.7 (calc)

## 2019-01-12 ENCOUNTER — Other Ambulatory Visit: Payer: Self-pay

## 2019-01-13 ENCOUNTER — Ambulatory Visit: Payer: BLUE CROSS/BLUE SHIELD | Admitting: Family Medicine

## 2019-01-13 ENCOUNTER — Ambulatory Visit: Payer: Self-pay | Admitting: Family Medicine

## 2019-01-13 ENCOUNTER — Encounter: Payer: Self-pay | Admitting: Family Medicine

## 2019-01-13 VITALS — BP 124/66 | HR 76 | Temp 98.8°F | Resp 14 | Ht 60.0 in | Wt 219.0 lb

## 2019-01-13 DIAGNOSIS — M67441 Ganglion, right hand: Secondary | ICD-10-CM

## 2019-01-13 DIAGNOSIS — E669 Obesity, unspecified: Secondary | ICD-10-CM | POA: Insufficient documentation

## 2019-01-13 DIAGNOSIS — E1169 Type 2 diabetes mellitus with other specified complication: Secondary | ICD-10-CM | POA: Insufficient documentation

## 2019-01-13 DIAGNOSIS — I10 Essential (primary) hypertension: Secondary | ICD-10-CM

## 2019-01-13 DIAGNOSIS — E119 Type 2 diabetes mellitus without complications: Secondary | ICD-10-CM | POA: Diagnosis not present

## 2019-01-13 NOTE — Assessment & Plan Note (Signed)
discussed hand surgeon referral, she wants to hold at this time as not causing pain

## 2019-01-13 NOTE — Progress Notes (Signed)
   Subjective:    Patient ID: Christina Arroyo, female    DOB: 11/09/1970, 48 y.o.   MRN: [id number]  Patient presents for Follow-up (is fasting)   Pt here to f/u chronic medical problems and due for fasting labs     HTN- she is taking lisinopril 20mg  once a day   checks BP at home 120/ 80's at home     DM- new onset in May with A1C  6.5%, she has been working on dietary changes   she has cut back on juices and sweets  change to wheat bread    Not exercising   Hyperlipidemia-  Lipids normal in May, LDL was 91   she is taking mulitivtamin    CYst on right hand base of thumb for months, no pain , just knows its there    Using miralax as needed, doing well with this     GYN- Dr. Glo Herring    No regular dentist     Review Of Systems:  GEN- denies fatigue, fever, weight loss,weakness, recent illness HEENT- denies eye drainage, change in vision, nasal discharge, CVS- denies chest pain, palpitations RESP- denies SOB, cough, wheeze ABD- denies N/V, change in stools, abd pain GU- denies dysuria, hematuria, dribbling, incontinence MSK- denies joint pain, muscle aches, injury Neuro- denies headache, dizziness, syncope, seizure activity       Objective:    BP 124/66   Pulse 76   Temp 98.8 F (37.1 C) (Oral)   Resp 14   Ht 5' (1.524 m)   Wt 219 lb (99.3 kg)   LMP 12/21/2018 Comment: regular  SpO2 97%   BMI 42.77 kg/m  GEN- NAD, alert and oriented x3,obese  HEENT- PERRL, EOMI, non injected sclera, pink conjunctiva, MMM, oropharynx clear Neck- Supple, no thyromegaly CVS- RRR, no murmur RESP-CTAB ABD-NABS,soft,NT,ND MSK- Right hand base of thumb pea size nodule, NT, FROM of thumb , able to grasp and make fist  EXT- No edema Pulses- Radial, DP- 2+        Assessment & Plan:      Problem List Items Addressed This Visit      Unprioritized   Diabetes mellitus without complication ([place])    New onset, no current meds She has been working on dietary changes Recheck  A1C Discussed healthy eating, start aerobic exercise 30 minutes 2 days a week Increase water       Relevant Orders   Lipid panel   Hemoglobin A1c   Ganglion cyst of finger of right hand    discussed hand surgeon referral, she wants to hold at this time as not causing pain      Hypertension - Primary    Controlled no changes       Relevant Orders   CBC with Differential/Platelet   Comprehensive metabolic panel   Morbid obesity ([place])   Relevant Orders   Lipid panel   Hemoglobin A1c      Note: This dictation was prepared with Dragon dictation along with smaller phrase technology. Any transcriptional errors that result from this process are unintentional.

## 2019-01-13 NOTE — Assessment & Plan Note (Signed)
New onset, no current meds She has been working on dietary changes Recheck A1C Discussed healthy eating, start aerobic exercise 30 minutes 2 days a week Increase water

## 2019-01-13 NOTE — Patient Instructions (Signed)
F/U 6 months for Physical  

## 2019-01-13 NOTE — Assessment & Plan Note (Signed)
Controlled no changes 

## 2019-01-14 LAB — HEMOGLOBIN A1C
Hgb A1c MFr Bld: 6.3 % of total Hgb — ABNORMAL HIGH (ref <5.7)
Mean Plasma Glucose: 134 (calc)
eAG (mmol/L): 7.4 (calc)

## 2019-01-14 LAB — LIPID PANEL
Cholesterol: 169 mg/dL (ref <200)
HDL: 66 mg/dL (ref >=50)
LDL Cholesterol (Calc): 89 mg/dL (calc)
Non-HDL Cholesterol (Calc): 103 mg/dL (calc) (ref <130)
Total CHOL/HDL Ratio: 2.6 (calc) (ref <5.0)
Triglycerides: 53 mg/dL (ref <150)

## 2019-01-14 LAB — COMPREHENSIVE METABOLIC PANEL
AG Ratio: 1.7 (calc) (ref 1.0–2.5)
ALT: 66 U/L — ABNORMAL HIGH (ref 6–29)
AST: 25 U/L (ref 10–35)
Albumin: 4.5 g/dL (ref 3.6–5.1)
Alkaline phosphatase (APISO): 53 U/L (ref 31–125)
BUN: 14 mg/dL (ref 7–25)
CO2: 26 mmol/L (ref 20–32)
Calcium: 10 mg/dL (ref 8.6–10.2)
Chloride: 102 mmol/L (ref 98–110)
Creat: 0.75 mg/dL (ref 0.50–1.10)
Globulin: 2.7 g/dL (calc) (ref 1.9–3.7)
Glucose, Bld: 126 mg/dL — ABNORMAL HIGH (ref 65–99)
Potassium: 4.8 mmol/L (ref 3.5–5.3)
Sodium: 136 mmol/L (ref 135–146)
Total Bilirubin: 0.4 mg/dL (ref 0.2–1.2)
Total Protein: 7.2 g/dL (ref 6.1–8.1)

## 2019-01-14 LAB — CBC WITH DIFFERENTIAL/PLATELET
Absolute Monocytes: 621 cells/uL (ref 200–950)
Basophils Absolute: 37 cells/uL (ref 0–200)
Basophils Relative: 0.5 %
Eosinophils Absolute: 270 cells/uL (ref 15–500)
Eosinophils Relative: 3.7 %
HCT: 44.7 % (ref 35.0–45.0)
Hemoglobin: 14.6 g/dL (ref 11.7–15.5)
Lymphs Abs: 2285 cells/uL (ref 850–3900)
MCH: 29 pg (ref 27.0–33.0)
MCHC: 32.7 g/dL (ref 32.0–36.0)
MCV: 88.7 fL (ref 80.0–100.0)
MPV: 10.5 fL (ref 7.5–12.5)
Monocytes Relative: 8.5 %
Neutro Abs: 4088 cells/uL (ref 1500–7800)
Neutrophils Relative %: 56 %
Platelets: 378 10*3/uL (ref 140–400)
RBC: 5.04 10*6/uL (ref 3.80–5.10)
RDW: 12.9 % (ref 11.0–15.0)
Total Lymphocyte: 31.3 %
WBC: 7.3 10*3/uL (ref 3.8–10.8)

## 2019-02-20 ENCOUNTER — Other Ambulatory Visit: Payer: Self-pay

## 2019-02-20 ENCOUNTER — Other Ambulatory Visit: Payer: BLUE CROSS/BLUE SHIELD

## 2019-02-20 DIAGNOSIS — R748 Abnormal levels of other serum enzymes: Secondary | ICD-10-CM | POA: Diagnosis not present

## 2019-02-21 LAB — COMPREHENSIVE METABOLIC PANEL
AG Ratio: 1.5 (calc) (ref 1.0–2.5)
ALT: 67 U/L — ABNORMAL HIGH (ref 6–29)
AST: 21 U/L (ref 10–35)
Albumin: 4.3 g/dL (ref 3.6–5.1)
Alkaline phosphatase (APISO): 54 U/L (ref 31–125)
BUN: 12 mg/dL (ref 7–25)
CO2: 26 mmol/L (ref 20–32)
Calcium: 9.9 mg/dL (ref 8.6–10.2)
Chloride: 100 mmol/L (ref 98–110)
Creat: 0.8 mg/dL (ref 0.50–1.10)
Globulin: 2.8 g/dL (calc) (ref 1.9–3.7)
Glucose, Bld: 155 mg/dL — ABNORMAL HIGH (ref 65–99)
Potassium: 5.1 mmol/L (ref 3.5–5.3)
Sodium: 135 mmol/L (ref 135–146)
Total Bilirubin: 0.4 mg/dL (ref 0.2–1.2)
Total Protein: 7.1 g/dL (ref 6.1–8.1)

## 2019-02-22 ENCOUNTER — Other Ambulatory Visit: Payer: BLUE CROSS/BLUE SHIELD

## 2019-04-20 ENCOUNTER — Other Ambulatory Visit: Payer: Self-pay | Admitting: Family Medicine

## 2019-04-20 DIAGNOSIS — I10 Essential (primary) hypertension: Secondary | ICD-10-CM

## 2019-04-22 ENCOUNTER — Other Ambulatory Visit: Payer: Self-pay | Admitting: Family Medicine

## 2019-04-22 DIAGNOSIS — I10 Essential (primary) hypertension: Secondary | ICD-10-CM

## 2019-04-24 ENCOUNTER — Other Ambulatory Visit: Payer: Self-pay | Admitting: Family Medicine

## 2019-04-24 DIAGNOSIS — U071 COVID-19: Secondary | ICD-10-CM | POA: Diagnosis not present

## 2019-04-24 DIAGNOSIS — Z20828 Contact with and (suspected) exposure to other viral communicable diseases: Secondary | ICD-10-CM | POA: Diagnosis not present

## 2019-04-24 DIAGNOSIS — I10 Essential (primary) hypertension: Secondary | ICD-10-CM

## 2019-04-25 DIAGNOSIS — U071 COVID-19: Secondary | ICD-10-CM | POA: Diagnosis not present

## 2019-04-25 DIAGNOSIS — Z20828 Contact with and (suspected) exposure to other viral communicable diseases: Secondary | ICD-10-CM | POA: Diagnosis not present

## 2019-05-01 DIAGNOSIS — U071 COVID-19: Secondary | ICD-10-CM | POA: Diagnosis not present

## 2019-05-03 DIAGNOSIS — U071 COVID-19: Secondary | ICD-10-CM | POA: Diagnosis not present

## 2019-05-03 DIAGNOSIS — Z20828 Contact with and (suspected) exposure to other viral communicable diseases: Secondary | ICD-10-CM | POA: Diagnosis not present

## 2019-05-08 DIAGNOSIS — U071 COVID-19: Secondary | ICD-10-CM | POA: Diagnosis not present

## 2019-05-09 DIAGNOSIS — U071 COVID-19: Secondary | ICD-10-CM | POA: Diagnosis not present

## 2019-05-09 DIAGNOSIS — Z20828 Contact with and (suspected) exposure to other viral communicable diseases: Secondary | ICD-10-CM | POA: Diagnosis not present

## 2019-05-15 DIAGNOSIS — Z20828 Contact with and (suspected) exposure to other viral communicable diseases: Secondary | ICD-10-CM | POA: Diagnosis not present

## 2019-05-15 DIAGNOSIS — U071 COVID-19: Secondary | ICD-10-CM | POA: Diagnosis not present

## 2019-05-16 ENCOUNTER — Encounter: Payer: Self-pay | Admitting: Family Medicine

## 2019-05-16 ENCOUNTER — Other Ambulatory Visit: Payer: Self-pay

## 2019-05-16 ENCOUNTER — Ambulatory Visit (INDEPENDENT_AMBULATORY_CARE_PROVIDER_SITE_OTHER): Payer: BLUE CROSS/BLUE SHIELD | Admitting: Family Medicine

## 2019-05-16 DIAGNOSIS — R112 Nausea with vomiting, unspecified: Secondary | ICD-10-CM | POA: Diagnosis not present

## 2019-05-16 MED ORDER — ONDANSETRON 4 MG PO TBDP: 4.0000 mg | ORAL_TABLET | Freq: Three times a day (TID) | ORAL | 0 refills | Status: DC | PRN

## 2019-05-16 NOTE — Progress Notes (Signed)
Virtual Visit via Telephone Note  I connected with Christina Arroyo on 05/16/19 at  11:13AM by telephone and verified that I am speaking with the correct person using two identifiers.      Pt location: at home   Physician location:  In office, [facility], Vic Blackbird MD     On call: patient and physician   I discussed the limitations, risks, security and privacy concerns of performing an evaluation and management service by telephone and the availability of in person appointments. I also discussed with the patient that there may be a patient responsible charge related to this service. The patient expressed understanding and agreed to proceed.   History of Present Illness:  Nausea vomiting for the past week with  decreased appetite but no abdominal pain. Started out of no where, she does work in [place] where there is current COVID outbreak, but her last test was negative, she was swabbed yesterday and awaiting that results Nausea worse first thing in AM, that is when she will typically vomit.  Nausea will then persist throughout the day but she does not have any other emesis typically.  There is no diarrhea associated no fever no cough no congestion.  She has history of cholecystectomy. She is not pregnant  No REFLUX problems     Observations/Objective: No acute distress noted over the phone  Assessment and Plan: Nausea with vomiting unclear cause.  Mostly severe nausea with emesis first thing in the morning.  Her gallbladder is out.  She does not have any pain accompanying the nausea.  Denies any reflux symptoms.  We will await her Covid testing we will go ahead and treat the nausea with Zofran ODT.  Try to push more fluids.  She does not have any bowel changes to suggest a colitis there are no other known sick contacts in her immediate household.  The next that would be if her Covid test is negative and the nausea persists she needs to be evaluated in the office with labs may need  imaging.  Follow Up Instructions:    I discussed the assessment and treatment plan with the patient. The patient was provided an opportunity to ask questions and all were answered. The patient agreed with the plan and demonstrated an understanding of the instructions.   The patient was advised to call back or seek an in-person evaluation if the symptoms worsen or if the condition fails to improve as anticipated.  I provided 6 minutes of non-face-to-face time during this encounter. End Time 11:19am  Vic Blackbird, MD

## 2019-05-17 ENCOUNTER — Telehealth: Payer: Self-pay | Admitting: *Deleted

## 2019-05-17 NOTE — Telephone Encounter (Signed)
Call placed to patient and patient made aware.  

## 2019-05-17 NOTE — Telephone Encounter (Signed)
At this time, continue nausea medication,  If she has black tarry stools that are consistent, go to ER to be evaluated, also if she has gross red blood Otherwise change may be due to not eating well

## 2019-05-17 NOTE — Telephone Encounter (Signed)
Received call from patient.   Reports that COVID test did return positive. States that she continues to have nausea and fatigue. Reports that she noted black pasty stools today. Denies abd pain/ bloating.   MD please advise.

## 2019-06-01 ENCOUNTER — Other Ambulatory Visit: Payer: Self-pay

## 2019-06-01 ENCOUNTER — Emergency Department (HOSPITAL_COMMUNITY): Payer: BLUE CROSS/BLUE SHIELD

## 2019-06-01 ENCOUNTER — Emergency Department (HOSPITAL_COMMUNITY)
Admission: EM | Admit: 2019-06-01 | Discharge: 2019-06-01 | Disposition: A | Discharge status: Home / Self Care | Payer: BLUE CROSS/BLUE SHIELD | Attending: Emergency Medicine | Admitting: Emergency Medicine

## 2019-06-01 ENCOUNTER — Encounter (HOSPITAL_COMMUNITY): Payer: Self-pay | Admitting: *Deleted

## 2019-06-01 DIAGNOSIS — R55 Syncope and collapse: Secondary | ICD-10-CM | POA: Insufficient documentation

## 2019-06-01 DIAGNOSIS — I10 Essential (primary) hypertension: Secondary | ICD-10-CM | POA: Diagnosis not present

## 2019-06-01 DIAGNOSIS — R000 Tachycardia, unspecified: Secondary | ICD-10-CM | POA: Insufficient documentation

## 2019-06-01 DIAGNOSIS — R0602 Shortness of breath: Secondary | ICD-10-CM | POA: Diagnosis not present

## 2019-06-01 DIAGNOSIS — Z79899 Other long term (current) drug therapy: Secondary | ICD-10-CM | POA: Diagnosis not present

## 2019-06-01 DIAGNOSIS — R42 Dizziness and giddiness: Secondary | ICD-10-CM | POA: Diagnosis not present

## 2019-06-01 HISTORY — DX: COVID-19: U07.1

## 2019-06-01 LAB — TROPONIN I (HIGH SENSITIVITY)
Troponin I (High Sensitivity): <2 ng/L (ref <18)
Troponin I (High Sensitivity): <2 ng/L (ref <18)

## 2019-06-01 LAB — CBC WITH DIFFERENTIAL/PLATELET
Abs Immature Granulocytes: 0.04 10*3/uL (ref 0.00–0.07)
Basophils Absolute: 0 10*3/uL (ref 0.0–0.1)
Basophils Relative: 0 %
Eosinophils Absolute: 0.1 10*3/uL (ref 0.0–0.5)
Eosinophils Relative: 2 %
HCT: 44.1 % (ref 36.0–46.0)
Hemoglobin: 14.3 g/dL (ref 12.0–15.0)
Immature Granulocytes: 1 %
Lymphocytes Relative: 27 %
Lymphs Abs: 1.7 10*3/uL (ref 0.7–4.0)
MCH: 28.8 pg (ref 26.0–34.0)
MCHC: 32.4 g/dL (ref 30.0–36.0)
MCV: 88.9 fL (ref 80.0–100.0)
Monocytes Absolute: 0.5 10*3/uL (ref 0.1–1.0)
Monocytes Relative: 8 %
Neutro Abs: 4.1 10*3/uL (ref 1.7–7.7)
Neutrophils Relative %: 62 %
Platelets: 352 10*3/uL (ref 150–400)
RBC: 4.96 MIL/uL (ref 3.87–5.11)
RDW: 12.8 % (ref 11.5–15.5)
WBC: 6.5 10*3/uL (ref 4.0–10.5)
nRBC: 0 % (ref 0.0–0.2)

## 2019-06-01 LAB — URINALYSIS, ROUTINE W REFLEX MICROSCOPIC
Bilirubin Urine: NEGATIVE
Glucose, UA: NEGATIVE mg/dL
Hgb urine dipstick: NEGATIVE
Ketones, ur: NEGATIVE mg/dL
Leukocytes,Ua: NEGATIVE
Nitrite: NEGATIVE
Protein, ur: NEGATIVE mg/dL
Specific Gravity, Urine: 1.002 — ABNORMAL LOW (ref 1.005–1.030)
pH: 7 (ref 5.0–8.0)

## 2019-06-01 LAB — COMPREHENSIVE METABOLIC PANEL
ALT: 42 U/L (ref 0–44)
AST: 21 U/L (ref 15–41)
Albumin: 4.3 g/dL (ref 3.5–5.0)
Alkaline Phosphatase: 54 U/L (ref 38–126)
Anion gap: 10 (ref 5–15)
BUN: 11 mg/dL (ref 6–20)
CO2: 25 mmol/L (ref 22–32)
Calcium: 9.6 mg/dL (ref 8.9–10.3)
Chloride: 99 mmol/L (ref 98–111)
Creatinine, Ser: 0.7 mg/dL (ref 0.44–1.00)
GFR calc Af Amer: >60 mL/min (ref >60)
GFR calc non Af Amer: >60 mL/min (ref >60)
Glucose, Bld: 153 mg/dL — ABNORMAL HIGH (ref 70–99)
Potassium: 3.5 mmol/L (ref 3.5–5.1)
Sodium: 134 mmol/L — ABNORMAL LOW (ref 135–145)
Total Bilirubin: 0.6 mg/dL (ref 0.3–1.2)
Total Protein: 8.5 g/dL — ABNORMAL HIGH (ref 6.5–8.1)

## 2019-06-01 LAB — RAPID URINE DRUG SCREEN, HOSP PERFORMED
Amphetamines: NOT DETECTED
Barbiturates: NOT DETECTED
Benzodiazepines: NOT DETECTED
Cocaine: NOT DETECTED
Opiates: NOT DETECTED
Tetrahydrocannabinol: NOT DETECTED

## 2019-06-01 LAB — TSH: TSH: 2.029 u[IU]/mL (ref 0.350–4.500)

## 2019-06-01 LAB — D-DIMER, QUANTITATIVE: D-Dimer, Quant: 0.97 ug/mL-FEU — ABNORMAL HIGH (ref 0.00–0.50)

## 2019-06-01 MED ORDER — SODIUM CHLORIDE 0.9 % IV BOLUS
1000.0000 mL | Freq: Once | INTRAVENOUS | Status: AC
  Administered 2019-06-01: 1000 mL via INTRAVENOUS

## 2019-06-01 MED ORDER — ONDANSETRON 4 MG PO TBDP: 4.0000 mg | ORAL_TABLET | Freq: Once | ORAL | Status: DC

## 2019-06-01 MED ORDER — IOHEXOL 350 MG/ML SOLN
100.0000 mL | Freq: Once | INTRAVENOUS | Status: AC | PRN
  Administered 2019-06-01: 100 mL via INTRAVENOUS

## 2019-06-01 NOTE — ED Provider Notes (Signed)
[name] EMERGENCY DEPARTMENT Provider Note   CSN: [id number] Arrival date & time: 06/01/19  1311     History Chief Complaint  Patient presents with  . Dizziness    Christina Arroyo is a 49 y.o. female.  The history is provided by the patient. No language interpreter was used.  Dizziness Quality:  Lightheadedness Severity:  Moderate Onset quality:  Gradual Timing:  Constant Progression:  Worsening Chronicity:  New Context: loss of consciousness   Relieved by:  Nothing Worsened by:  Nothing Ineffective treatments:  None tried Associated symptoms: no nausea   Pt reports she began feeling weak today.  Pt reports she felt like she was going to pass out.  Pt reports she had covid dec 28.  No chest pain. No abdominal pain       Past Medical History:  Diagnosis Date  . COVID-19   . Gall stones   . Hypertension     Patient Active Problem List   Diagnosis Date Noted  . Ganglion cyst of finger of right hand 01/13/2019  . Diabetes mellitus without complication ([place]) [id number]  . Morbid obesity ([place]) 01/13/2019  . Uterine fibroid 03/30/2016  . Family hx of colon cancer   . Chronic constipation 05/15/2015  . Vitamin D deficiency 11/30/2014  . Family history of colon cancer 11/22/2014  . Hypertension     Past Surgical History:  Procedure Laterality Date  . CHOLECYSTECTOMY N/A 08/23/2013   Procedure: LAPAROSCOPIC CHOLECYSTECTOMY;  Surgeon: Jamesetta So, MD;  Location: [facility] ORS;  Service: General;  Laterality: N/A;  . COLONOSCOPY N/A 10/28/2015   Procedure: COLONOSCOPY;  Surgeon: Danie Binder, MD;  Location: [facility] ENDO SUITE;  Service: Endoscopy;  Laterality: N/A;  8:30 Am  . TUBAL LIGATION  08/1997     OB History   No obstetric history on file.     Family History  Problem Relation Age of Onset  . Hypertension Father   . Cancer Father 46       colon? and prostate  . Diabetes Father   . Pulmonary embolism Mother   . Cancer Paternal Grandfather     Social History    Tobacco Use  . Smoking status: Never Smoker  . Smokeless tobacco: Never Used  Substance Use Topics  . Alcohol use: No  . Drug use: No    Home Medications Prior to Admission medications   Medication Sig Start Date End Date Taking? Authorizing Provider  Cholecalciferol 2000 units CAPS Take 1 capsule (2,000 Units total) daily by mouth. 04/02/17   Dixon, Stanton Kidney B, PA-C  lisinopril (ZESTRIL) 20 MG tablet TAKE ONE TABLET BY MOUTH DAILY. 04/24/19   Alycia Rossetti, MD  ondansetron (ZOFRAN ODT) 4 MG disintegrating tablet Take 1 tablet (4 mg total) by mouth every 8 (eight) hours as needed for nausea or vomiting. 05/16/19   Alycia Rossetti, MD    Allergies    Patient has no known allergies.  Review of Systems   Review of Systems  Gastrointestinal: Negative for nausea.  Neurological: Positive for dizziness.  All other systems reviewed and are negative.   Physical Exam Updated Vital Signs BP 139/84 (BP Location: Right Arm)   Pulse (!) 134   Temp 98 F (36.7 C) (Oral)   Resp 20   Ht 5' (1.524 m)   Wt 92.1 kg   LMP 05/13/2019   SpO2 100%   BMI 39.65 kg/m   Physical Exam Vitals and nursing note reviewed.  Constitutional:  Appearance: She is well-developed.  HENT:     Head: Normocephalic.     Mouth/Throat:     Mouth: Mucous membranes are moist.  Cardiovascular:     Rate and Rhythm: Tachycardia present.  Pulmonary:     Effort: Pulmonary effort is normal.  Abdominal:     General: Abdomen is flat. There is no distension.  Musculoskeletal:        General: Normal range of motion.     Cervical back: Normal range of motion.  Skin:    Capillary Refill: Capillary refill takes less than 2 seconds.  Neurological:     General: No focal deficit present.     Mental Status: She is alert and oriented to person, place, and time.  Psychiatric:        Mood and Affect: Mood normal.     ED Results / Procedures / Treatments   Labs (all labs ordered are listed, but only  abnormal results are displayed) Labs Reviewed  COMPREHENSIVE METABOLIC PANEL - Abnormal; Notable for the following components:      Result Value   Sodium 134 (*)    Glucose, Bld 153 (*)    Total Protein 8.5 (*)    All other components within normal limits  TSH  CBC WITH DIFFERENTIAL/PLATELET  URINALYSIS, ROUTINE W REFLEX MICROSCOPIC  RAPID URINE DRUG SCREEN, HOSP PERFORMED  TROPONIN I (HIGH SENSITIVITY)    EKG EKG Interpretation  Date/Time:  Thursday June 01 2019 13:28:25 EST Ventricular Rate:  133 PR Interval:  126 QRS Duration: 66 QT Interval:  286 QTC Calculation: 425 R Axis:   37 Text Interpretation: Sinus tachycardia Possible Left atrial enlargement Nonspecific T wave abnormality Abnormal ECG Confirmed by Davonna Belling [phone]) on 06/01/2019 1:49:54 PM   Radiology No results found.  Procedures Procedures (including critical care time)  Medications Ordered in ED Medications  sodium chloride 0.9 % bolus 1,000 mL (1,000 mLs Intravenous New Bag/Given 06/01/19 1427)    ED Course  I have reviewed the triage vital signs and the nursing notes.  Pertinent labs & imaging results that were available during my care of the patient were reviewed by me and considered in my medical decision making (see chart for details).  Clinical Course as of May 31 1544  Thu Jun 01, 2019  1533 TSH: 2.029 [LS]    Clinical Course User Index [LS] [name]   MDM Rules/Calculators/A&P                      MDM: Pt"s care turned over to Evalee Jefferson PA. Final Clinical Impression(s) / ED Diagnoses Final diagnoses:  Tachycardia    Rx / DC Orders ED Discharge Orders    None       [name] 06/01/19 1639    Davonna Belling, MD 06/06/19 1456

## 2019-06-01 NOTE — ED Triage Notes (Signed)
Pt with dizziness since 0900 this morning and has got worse for past 1.5 hours ago.  Feels like she will pass out.

## 2019-06-01 NOTE — Discharge Instructions (Signed)
Your lab tests, ekg and imaging tonight are reassuring with no significant findings.  It is possible your symptoms are still residual symptoms from your covid infection.  Rest and make sure you are drinking plenty of fluids over the weekend.  See your MD for a recheck if not improving with this plan.

## 2019-06-01 NOTE — ED Notes (Signed)
ekg handed to Dr. Alvino Chapel

## 2019-06-01 NOTE — ED Provider Notes (Signed)
Pt signed out to me by Christina Low, PA. Pt with dizziness, near syncope and dizziness, received IV fluids here and is now feeling improved.  She was diagnosed with Covid on 12/28, now out of quarantine, reports sx included nausea, body aches only.  Pending d dimer given persistent borderline tachycardia. No cp, no sob.  D dimer is negative.  Pt discharged home with plans for f/u with pcp prn if sx persist or worsen.    Christina Arroyo, Christina Arroyo 06/01/19 2107    Christina Chapel, MD 06/01/19 [phone]

## 2019-06-05 ENCOUNTER — Encounter: Payer: Self-pay | Admitting: Family Medicine

## 2019-06-05 ENCOUNTER — Other Ambulatory Visit: Payer: Self-pay

## 2019-06-05 ENCOUNTER — Ambulatory Visit: Payer: BLUE CROSS/BLUE SHIELD | Admitting: Family Medicine

## 2019-06-05 VITALS — BP 128/68 | HR 78 | Temp 98.4°F | Resp 14 | Ht 60.0 in | Wt 210.0 lb

## 2019-06-05 DIAGNOSIS — R911 Solitary pulmonary nodule: Secondary | ICD-10-CM

## 2019-06-05 DIAGNOSIS — I10 Essential (primary) hypertension: Secondary | ICD-10-CM | POA: Diagnosis not present

## 2019-06-05 DIAGNOSIS — R000 Tachycardia, unspecified: Secondary | ICD-10-CM | POA: Diagnosis not present

## 2019-06-05 DIAGNOSIS — R42 Dizziness and giddiness: Secondary | ICD-10-CM | POA: Diagnosis not present

## 2019-06-05 DIAGNOSIS — E119 Type 2 diabetes mellitus without complications: Secondary | ICD-10-CM | POA: Diagnosis not present

## 2019-06-05 NOTE — Progress Notes (Signed)
Subjective:    Patient ID: Christina Arroyo, female    DOB: 1970/10/22, 49 y.o.   MRN: [id number]  Patient presents for ER F/U (tachycardia, syncope- dehydration) Pt here for ER follow up, she was positive for  COVID 19 at the end of December. She is over her quarantine period. She initially had body aches for 1 days, followed by nausea and vomiting. She was actually feeling better, until she became lightheaded out of no where and felt like we was going to pass out. IN ER she was tachycardic, HR 130's, cardiac work up neg, had elevated d dimer but CTA Neg for PE or PNA/effusion, she did have incidental right lung nodule.  Renal function was preserved. She was given IVF and felt better Continued on lisinopril Since last week, she has still had a few more episodes of feeling lightheaded. The last one was today, she was at work sitting at her desk and all of a sudden for lightheaded. She is not sure if anxiety is a part of it, as she will get a flushing feeling that comes over her as well.   No change in bowels or bladder, no myalgia's or joint pain, no rash, no difficulty breathing, no chest pain  Review Of Systems: per above  GEN- denies fatigue, fever, weight loss,weakness, recent illness HEENT- denies eye drainage, change in vision, nasal discharge, CVS- denies chest pain, palpitations RESP- denies SOB, cough, wheeze ABD- denies N/V, change in stools, abd pain GU- denies dysuria, hematuria, dribbling, incontinence MSK- denies joint pain, muscle aches, injury Neuro- denies headache, +dizziness, syncope, seizure activity       Objective:    BP 128/68   Pulse 78   Temp 98.4 F (36.9 C) (Temporal)   Resp 14   Ht 5' (1.524 m)   Wt 210 lb (95.3 kg)   LMP 05/13/2019   SpO2 96%   BMI 41.01 kg/m  GEN- NAD, alert and oriented x3 HEENT- PERRL, EOMI, non injected sclera, pink conjunctiva, MMM, oropharynx clear, TM clear no effusion Neck- Supple, no thyromegaly, no bruit  CVS- RRR, no  murmur RESP-CTAB ABD-NABS,soft,NT,ND NEURO CNII-XII in tact no focal deficits  Psych- normal affect and mood  EXT- No edema Pulses- Radial, DP- 2+  EKG- NSR,no ST chanes, , flat t waves V3      Assessment & Plan:      Problem List Items Addressed This Visit      Unprioritized   Diabetes mellitus without complication ([place])   Relevant Orders   Hemoglobin A1c   Hypertension    Unclear cause of the spell she is having.  This could be residual from her recent COVID-19 infection.  Also could be associated with the tachycardia episodes.  Unfortunately willing to she was tachycardic when she was seen in the emergency room unclear if these spells are also happening when she gets dizzy at home.  At this time her blood pressure looks okay.  Her EKG does not show any acute changes.  I recommend monitoring her symptoms.  Keep herself hydrated.  I'm going to  recheck her metabolic panel and her [id number] as her blood sugar was 150 in the ER she had only had a little bit of orange juice.  If she gets another spell recommended she check her blood pressure as well as her heart rate at that time.  If she is having tachycardic episodes I would recommend getting an event monitor placed.  Anxiety is definitely in the background as  she was recently sick with COVID-19.  Her symptoms are quite short-lived when they do occur so we will hold off on an anxiety medication at this time.      Relevant Orders   Basic metabolic panel   TSH   Incidental lung nodule, > 70mm and < 11mm    Other Visit Diagnoses    Tachycardia    -  Primary   Relevant Orders   EKG 12-Lead (Completed)   Basic metabolic panel   Lightheadedness       Relevant Orders   EKG 12-Lead (Completed)   Basic metabolic panel   TSH      Note: This dictation was prepared with Dragon dictation along with smaller phrase technology. Any transcriptional errors that result from this process are unintentional.

## 2019-06-05 NOTE — Patient Instructions (Signed)
F/U pending results   

## 2019-06-05 NOTE — Assessment & Plan Note (Signed)
Unclear cause of the spell she is having.  This could be residual from her recent COVID-19 infection.  Also could be associated with the tachycardia episodes.  Unfortunately willing to she was tachycardic when she was seen in the emergency room unclear if these spells are also happening when she gets dizzy at home.  At this time her blood pressure looks okay.  Her EKG does not show any acute changes.  I recommend monitoring her symptoms.  Keep herself hydrated.  I'm going to  recheck her metabolic panel and her [id number] as her blood sugar was 150 in the ER she had only had a little bit of orange juice.  If she gets another spell recommended she check her blood pressure as well as her heart rate at that time.  If she is having tachycardic episodes I would recommend getting an event monitor placed.  Anxiety is definitely in the background as she was recently sick with COVID-19.  Her symptoms are quite short-lived when they do occur so we will hold off on an anxiety medication at this time.

## 2019-06-06 LAB — BASIC METABOLIC PANEL
BUN: 7 mg/dL (ref 7–25)
CO2: 27 mmol/L (ref 20–32)
Calcium: 9.9 mg/dL (ref 8.6–10.2)
Chloride: 100 mmol/L (ref 98–110)
Creat: 0.71 mg/dL (ref 0.50–1.10)
Glucose, Bld: 89 mg/dL (ref 65–99)
Potassium: 4.5 mmol/L (ref 3.5–5.3)
Sodium: 136 mmol/L (ref 135–146)

## 2019-06-06 LAB — TSH: TSH: 3.31 mIU/L

## 2019-06-06 LAB — HEMOGLOBIN A1C
Hgb A1c MFr Bld: 6.6 % of total Hgb — ABNORMAL HIGH (ref <5.7)
Mean Plasma Glucose: 143 (calc)
eAG (mmol/L): 7.9 (calc)

## 2019-07-18 ENCOUNTER — Encounter: Payer: BLUE CROSS/BLUE SHIELD | Admitting: Family Medicine

## 2019-07-26 ENCOUNTER — Other Ambulatory Visit: Payer: Self-pay | Admitting: Family Medicine

## 2019-07-26 DIAGNOSIS — I10 Essential (primary) hypertension: Secondary | ICD-10-CM

## 2019-08-01 ENCOUNTER — Ambulatory Visit (INDEPENDENT_AMBULATORY_CARE_PROVIDER_SITE_OTHER): Payer: BC Managed Care – PPO | Admitting: Family Medicine

## 2019-08-01 ENCOUNTER — Encounter: Payer: Self-pay | Admitting: Family Medicine

## 2019-08-01 ENCOUNTER — Other Ambulatory Visit: Payer: Self-pay

## 2019-08-01 VITALS — BP 130/68 | HR 84 | Temp 98.0°F | Resp 14 | Ht 60.0 in | Wt 211.0 lb

## 2019-08-01 DIAGNOSIS — E119 Type 2 diabetes mellitus without complications: Secondary | ICD-10-CM | POA: Diagnosis not present

## 2019-08-01 DIAGNOSIS — E669 Obesity, unspecified: Secondary | ICD-10-CM

## 2019-08-01 DIAGNOSIS — I10 Essential (primary) hypertension: Secondary | ICD-10-CM | POA: Diagnosis not present

## 2019-08-01 DIAGNOSIS — E559 Vitamin D deficiency, unspecified: Secondary | ICD-10-CM

## 2019-08-01 NOTE — Assessment & Plan Note (Signed)
Diabetes mellitus type 2.  She is working on dietary changes.  I did discuss adding Metformin she wants to hold off on medications and see how she improves with reducing her carbohydrates.  With advised to cut out sugary beverages.  She is also working on cutting down her snacks such as cookies and cakes.  She is also trying to increase her water intake.

## 2019-08-01 NOTE — Patient Instructions (Addendum)
Work on nutrition  F/U 4 months for PHYSICAL

## 2019-08-01 NOTE — Progress Notes (Signed)
   Subjective:    Patient ID: Christina Arroyo, female    DOB: 1970-08-12, 49 y.o.   MRN: [id number]  Patient presents for Follow-up (is fasting)  Pt here to f/u chronic medical probems  Medications reviewed  HTN- her BP at home has been  130/ 80's her dizzy spells have improved. He is also starting to exercise to try to improve her overall endurance.  I also work on her weight.  Diabetes mellitus type II her last A1c was 6.6% she is trying to control with dietary changes.  She is due for recheck today.  She is taking vitamin D as prescribed    Review Of Systems:  GEN- denies fatigue, fever, weight loss,weakness, recent illness HEENT- denies eye drainage, change in vision, nasal discharge, CVS- denies chest pain, palpitations RESP- denies SOB, cough, wheeze ABD- denies N/V, change in stools, abd pain GU- denies dysuria, hematuria, dribbling, incontinence MSK- denies joint pain, muscle aches, injury Neuro- denies headache, dizziness, syncope, seizure activity       Objective:    BP 130/68   Pulse 84   Temp 98 F (36.7 C) (Temporal)   Resp 14   Ht 5' (1.524 m)   Wt 211 lb (95.7 kg)   LMP 07/08/2019   SpO2 96%   BMI 41.21 kg/m  GEN- NAD, alert and oriented x3 HEENT- PERRL, EOMI, non injected sclera, pink conjunctiva, MMM, oropharynx clear Neck- Supple, no thyromegaly CVS- RRR, no murmur RESP-CTAB ABD-NABS,soft,NT,ND EXT- No edema Pulses- Radial, DP- 2+        Assessment & Plan:      Problem List Items Addressed This Visit      Unprioritized   Class 3 obesity   Relevant Orders   Lipid panel   Diabetes mellitus without complication ([place])    Diabetes mellitus type 2.  She is working on dietary changes.  I did discuss adding Metformin she wants to hold off on medications and see how she improves with reducing her carbohydrates.  With advised to cut out sugary beverages.  She is also working on cutting down her snacks such as cookies and cakes.  She is also  trying to increase her water intake.      Relevant Orders   Lipid panel   Microalbumin / creatinine urine ratio   Hypertension - Primary    Blood pressure looks good today no change in lisinopril.      Relevant Orders   CBC with Differential/Platelet   Comprehensive metabolic panel   Vitamin D deficiency      Note: This dictation was prepared with Dragon dictation along with smaller phrase technology. Any transcriptional errors that result from this process are unintentional.

## 2019-08-01 NOTE — Assessment & Plan Note (Signed)
Blood pressure looks good today no change in lisinopril.

## 2019-08-02 LAB — CBC WITH DIFFERENTIAL/PLATELET
Absolute Monocytes: 533 cells/uL (ref 200–950)
Basophils Absolute: 37 cells/uL (ref 0–200)
Basophils Relative: 0.5 %
Eosinophils Absolute: 252 cells/uL (ref 15–500)
Eosinophils Relative: 3.4 %
HCT: 43.6 % (ref 35.0–45.0)
Hemoglobin: 14.4 g/dL (ref 11.7–15.5)
Lymphs Abs: 2464 cells/uL (ref 850–3900)
MCH: 29.1 pg (ref 27.0–33.0)
MCHC: 33 g/dL (ref 32.0–36.0)
MCV: 88.1 fL (ref 80.0–100.0)
MPV: 10.3 fL (ref 7.5–12.5)
Monocytes Relative: 7.2 %
Neutro Abs: 4114 cells/uL (ref 1500–7800)
Neutrophils Relative %: 55.6 %
Platelets: 377 10*3/uL (ref 140–400)
RBC: 4.95 10*6/uL (ref 3.80–5.10)
RDW: 13.1 % (ref 11.0–15.0)
Total Lymphocyte: 33.3 %
WBC: 7.4 10*3/uL (ref 3.8–10.8)

## 2019-08-02 LAB — LIPID PANEL
Cholesterol: 185 mg/dL (ref <200)
HDL: 57 mg/dL (ref >=50)
LDL Cholesterol (Calc): 114 mg/dL (calc) — ABNORMAL HIGH
Non-HDL Cholesterol (Calc): 128 mg/dL (calc) (ref <130)
Total CHOL/HDL Ratio: 3.2 (calc) (ref <5.0)
Triglycerides: 57 mg/dL (ref <150)

## 2019-08-02 LAB — COMPREHENSIVE METABOLIC PANEL
AG Ratio: 1.5 (calc) (ref 1.0–2.5)
ALT: 32 U/L — ABNORMAL HIGH (ref 6–29)
AST: 17 U/L (ref 10–35)
Albumin: 4.4 g/dL (ref 3.6–5.1)
Alkaline phosphatase (APISO): 55 U/L (ref 31–125)
BUN: 16 mg/dL (ref 7–25)
CO2: 27 mmol/L (ref 20–32)
Calcium: 10 mg/dL (ref 8.6–10.2)
Chloride: 101 mmol/L (ref 98–110)
Creat: 0.72 mg/dL (ref 0.50–1.10)
Globulin: 2.9 g/dL (calc) (ref 1.9–3.7)
Glucose, Bld: 115 mg/dL — ABNORMAL HIGH (ref 65–99)
Potassium: 5.1 mmol/L (ref 3.5–5.3)
Sodium: 137 mmol/L (ref 135–146)
Total Bilirubin: 0.3 mg/dL (ref 0.2–1.2)
Total Protein: 7.3 g/dL (ref 6.1–8.1)

## 2019-08-02 LAB — MICROALBUMIN / CREATININE URINE RATIO
Creatinine, Urine: 87 mg/dL (ref 20–275)
Microalb Creat Ratio: 11 mcg/mg creat (ref <30)
Microalb, Ur: 1 mg/dL

## 2019-08-15 DIAGNOSIS — Z20828 Contact with and (suspected) exposure to other viral communicable diseases: Secondary | ICD-10-CM | POA: Diagnosis not present

## 2019-08-15 DIAGNOSIS — U071 COVID-19: Secondary | ICD-10-CM | POA: Diagnosis not present

## 2019-08-28 DIAGNOSIS — Z20828 Contact with and (suspected) exposure to other viral communicable diseases: Secondary | ICD-10-CM | POA: Diagnosis not present

## 2019-08-28 DIAGNOSIS — U071 COVID-19: Secondary | ICD-10-CM | POA: Diagnosis not present

## 2019-09-04 DIAGNOSIS — U071 COVID-19: Secondary | ICD-10-CM | POA: Diagnosis not present

## 2019-09-05 DIAGNOSIS — Z20828 Contact with and (suspected) exposure to other viral communicable diseases: Secondary | ICD-10-CM | POA: Diagnosis not present

## 2019-09-05 DIAGNOSIS — U071 COVID-19: Secondary | ICD-10-CM | POA: Diagnosis not present

## 2019-10-31 ENCOUNTER — Other Ambulatory Visit: Payer: Self-pay | Admitting: *Deleted

## 2019-10-31 DIAGNOSIS — I10 Essential (primary) hypertension: Secondary | ICD-10-CM

## 2019-10-31 MED ORDER — LISINOPRIL 20 MG PO TABS: 20.0000 mg | ORAL_TABLET | Freq: Every day | ORAL | 3 refills | Status: DC

## 2019-12-19 ENCOUNTER — Ambulatory Visit: Payer: BC Managed Care – PPO | Admitting: Family Medicine

## 2019-12-19 ENCOUNTER — Encounter: Payer: Self-pay | Admitting: Family Medicine

## 2019-12-19 ENCOUNTER — Other Ambulatory Visit: Payer: Self-pay

## 2019-12-19 VITALS — BP 130/78 | HR 80 | Temp 97.8°F | Resp 16 | Ht 60.0 in | Wt 201.0 lb

## 2019-12-19 DIAGNOSIS — Z0001 Encounter for general adult medical examination with abnormal findings: Secondary | ICD-10-CM | POA: Diagnosis not present

## 2019-12-19 DIAGNOSIS — E669 Obesity, unspecified: Secondary | ICD-10-CM | POA: Diagnosis not present

## 2019-12-19 DIAGNOSIS — Z23 Encounter for immunization: Secondary | ICD-10-CM | POA: Diagnosis not present

## 2019-12-19 DIAGNOSIS — E119 Type 2 diabetes mellitus without complications: Secondary | ICD-10-CM | POA: Diagnosis not present

## 2019-12-19 DIAGNOSIS — Z0000 Encounter for general adult medical examination without abnormal findings: Secondary | ICD-10-CM

## 2019-12-19 DIAGNOSIS — E559 Vitamin D deficiency, unspecified: Secondary | ICD-10-CM

## 2019-12-19 DIAGNOSIS — Z1231 Encounter for screening mammogram for malignant neoplasm of breast: Secondary | ICD-10-CM

## 2019-12-19 DIAGNOSIS — I10 Essential (primary) hypertension: Secondary | ICD-10-CM

## 2019-12-19 DIAGNOSIS — Z1159 Encounter for screening for other viral diseases: Secondary | ICD-10-CM

## 2019-12-19 DIAGNOSIS — Z114 Encounter for screening for human immunodeficiency virus [HIV]: Secondary | ICD-10-CM

## 2019-12-19 NOTE — Progress Notes (Signed)
Subjective:    Patient ID: Christina Arroyo, female    DOB: 09-16-1970, 49 y.o.   MRN: [id number]  Patient presents for Annual Exam (is fasting)  Patient here for complete physical exam. Medications and history reviewed.  HTn- taking lisinopril 20mg  once a day , no SE with the medication  she does check BP 315'V systolic before meds at home  Still has occ lightheadedness, short lived, no palpitations associated, typically occurs around 10am No symptoms with exertion, no syncope, no HA  Vitamin D def- taking daily supplement, due for recheck   DM - currently diet controlled, trying to reduce carbs/seets in diet , last A1C 6.6% in Jan LDL  114 in March  Due for recheck on labs  Obesity- weight down 10lbs since March   Due for PAP Smear- has appt with GYN   Immunizations- COVID-19 UTD, TDAP UTD , due fpr PNA vaccine   Due for HEP C/HIV Screen   Working out at [employer], 2-3 days a week for  45 min- 1 hour  Nutrition- she is drinking mostly water, cut out soda, she has reduced sweets   Due for mammogram   Eye Exam- [place]  - told exam normal   Does not have dentist      Review Of Systems:  GEN- denies fatigue, fever, weight loss,weakness, recent illness HEENT- denies eye drainage, change in vision, nasal discharge, CVS- denies chest pain, palpitations RESP- denies SOB, cough, wheeze ABD- denies N/V, change in stools, abd pain GU- denies dysuria, hematuria, dribbling, incontinence MSK- denies joint pain, muscle aches, injury Neuro- denies headache, dizziness, syncope, seizure activity       Objective:    BP 130/78   Pulse 80   Temp 97.8 F (36.6 C) (Temporal)   Resp 16   Ht 5' (1.524 m)   Wt 201 lb (91.2 kg)   LMP 11/28/2019 Comment: regular  SpO2 96%   BMI 39.26 kg/m  GEN- NAD, alert and oriented x3 HEENT- PERRL, EOMI, non injected sclera, pink conjunctiva, MMM, oropharynx clear Neck- Supple, no thyromegaly CVS- RRR, no  murmur RESP-CTAB ABD-NABS,soft,NT,ND EXT- No edema Pulses- Radial, DP- 2+    FALL/DEPRESSION/AUDIT C neg    Assessment & Plan:      Problem List Items Addressed This Visit      Unprioritized   Class 2 obesity   Relevant Orders   Lipid panel   Hemoglobin A1c   Diabetes mellitus without complication (HCC)   Relevant Orders   HM DIABETES FOOT EXAM (Completed)   Lipid panel   Hypertension    Controlled, no changes to meds Continue with dietary changes Exercise routine  Recheck fasting labs today  Vitamin D level      Relevant Orders   Hepatitis C antibody   Vitamin D deficiency   Relevant Orders   Vitamin D, 25-hydroxy    Other Visit Diagnoses    Routine general medical examination at a health care facility    -  Primary   CPE done, PNA vaccine given, pt to schedule mammo, obtain eye exam, pt to schedule with GYN   Relevant Orders   CBC with Differential/Platelet   Comprehensive metabolic panel   Lipid panel   Encounter for screening mammogram for malignant neoplasm of breast       Relevant Orders   HIV Antibody (routine testing w rflx)   Need for hepatitis C screening test       Encounter for screening for HIV  Note: This dictation was prepared with Dragon dictation along with smaller phrase technology. Any transcriptional errors that result from this process are unintentional.

## 2019-12-19 NOTE — Addendum Note (Signed)
Addended by: Sheral Flow on: 12/19/2019 09:55 AM   Modules accepted: Orders

## 2019-12-19 NOTE — Patient Instructions (Signed)
[facility]- Linna Hoff

## 2019-12-19 NOTE — Assessment & Plan Note (Signed)
Controlled, no changes to meds Continue with dietary changes Exercise routine  Recheck fasting labs today  Vitamin D level

## 2019-12-20 ENCOUNTER — Other Ambulatory Visit (HOSPITAL_COMMUNITY): Payer: Self-pay | Admitting: Family Medicine

## 2019-12-20 DIAGNOSIS — Z1231 Encounter for screening mammogram for malignant neoplasm of breast: Secondary | ICD-10-CM

## 2019-12-20 LAB — CBC WITH DIFFERENTIAL/PLATELET
Absolute Monocytes: 470 cells/uL (ref 200–950)
Basophils Absolute: 30 cells/uL (ref 0–200)
Basophils Relative: 0.6 %
Eosinophils Absolute: 170 cells/uL (ref 15–500)
Eosinophils Relative: 3.4 %
HCT: 43 % (ref 35.0–45.0)
Hemoglobin: 14.3 g/dL (ref 11.7–15.5)
Lymphs Abs: 1730 cells/uL (ref 850–3900)
MCH: 29.6 pg (ref 27.0–33.0)
MCHC: 33.3 g/dL (ref 32.0–36.0)
MCV: 89 fL (ref 80.0–100.0)
MPV: 10.7 fL (ref 7.5–12.5)
Monocytes Relative: 9.4 %
Neutro Abs: 2600 cells/uL (ref 1500–7800)
Neutrophils Relative %: 52 %
Platelets: 336 10*3/uL (ref 140–400)
RBC: 4.83 10*6/uL (ref 3.80–5.10)
RDW: 12.6 % (ref 11.0–15.0)
Total Lymphocyte: 34.6 %
WBC: 5 10*3/uL (ref 3.8–10.8)

## 2019-12-20 LAB — HIV ANTIBODY (ROUTINE TESTING W REFLEX): HIV 1&2 Ab, 4th Generation: NONREACTIVE

## 2019-12-20 LAB — LIPID PANEL
Cholesterol: 161 mg/dL (ref <200)
HDL: 53 mg/dL (ref >=50)
LDL Cholesterol (Calc): 94 mg/dL (calc)
Non-HDL Cholesterol (Calc): 108 mg/dL (calc) (ref <130)
Total CHOL/HDL Ratio: 3 (calc) (ref <5.0)
Triglycerides: 48 mg/dL (ref <150)

## 2019-12-20 LAB — COMPREHENSIVE METABOLIC PANEL
AG Ratio: 1.5 (calc) (ref 1.0–2.5)
ALT: 36 U/L — ABNORMAL HIGH (ref 6–29)
AST: 20 U/L (ref 10–35)
Albumin: 4.1 g/dL (ref 3.6–5.1)
Alkaline phosphatase (APISO): 55 U/L (ref 31–125)
BUN: 11 mg/dL (ref 7–25)
CO2: 26 mmol/L (ref 20–32)
Calcium: 9.6 mg/dL (ref 8.6–10.2)
Chloride: 102 mmol/L (ref 98–110)
Creat: 0.82 mg/dL (ref 0.50–1.10)
Globulin: 2.7 g/dL (calc) (ref 1.9–3.7)
Glucose, Bld: 106 mg/dL — ABNORMAL HIGH (ref 65–99)
Potassium: 4.7 mmol/L (ref 3.5–5.3)
Sodium: 136 mmol/L (ref 135–146)
Total Bilirubin: 0.4 mg/dL (ref 0.2–1.2)
Total Protein: 6.8 g/dL (ref 6.1–8.1)

## 2019-12-20 LAB — HEMOGLOBIN A1C
Hgb A1c MFr Bld: 6 % of total Hgb — ABNORMAL HIGH (ref <5.7)
Mean Plasma Glucose: 126 (calc)
eAG (mmol/L): 7 (calc)

## 2019-12-20 LAB — HEPATITIS C ANTIBODY
Hepatitis C Ab: NONREACTIVE
SIGNAL TO CUT-OFF: 0.01 (ref <1.00)

## 2019-12-20 LAB — VITAMIN D 25 HYDROXY (VIT D DEFICIENCY, FRACTURES): Vit D, 25-Hydroxy: 43 ng/mL (ref 30–100)

## 2019-12-21 ENCOUNTER — Encounter: Payer: Self-pay | Admitting: *Deleted

## 2019-12-25 ENCOUNTER — Other Ambulatory Visit: Payer: Self-pay

## 2019-12-25 ENCOUNTER — Ambulatory Visit (HOSPITAL_COMMUNITY)
Admission: RE | Admit: 2019-12-25 | Discharge: 2019-12-25 | Disposition: A | Discharge status: Home / Self Care | Payer: BC Managed Care – PPO | Source: Ambulatory Visit | Attending: Family Medicine | Admitting: Family Medicine

## 2019-12-25 DIAGNOSIS — Z1231 Encounter for screening mammogram for malignant neoplasm of breast: Secondary | ICD-10-CM | POA: Insufficient documentation

## 2020-01-09 DIAGNOSIS — U071 COVID-19: Secondary | ICD-10-CM | POA: Diagnosis not present

## 2020-01-10 DIAGNOSIS — Z20828 Contact with and (suspected) exposure to other viral communicable diseases: Secondary | ICD-10-CM | POA: Diagnosis not present

## 2020-01-10 DIAGNOSIS — U071 COVID-19: Secondary | ICD-10-CM | POA: Diagnosis not present

## 2020-01-16 DIAGNOSIS — Z20828 Contact with and (suspected) exposure to other viral communicable diseases: Secondary | ICD-10-CM | POA: Diagnosis not present

## 2020-01-16 DIAGNOSIS — U071 COVID-19: Secondary | ICD-10-CM | POA: Diagnosis not present

## 2020-01-24 DIAGNOSIS — Z20828 Contact with and (suspected) exposure to other viral communicable diseases: Secondary | ICD-10-CM | POA: Diagnosis not present

## 2020-01-24 DIAGNOSIS — U071 COVID-19: Secondary | ICD-10-CM | POA: Diagnosis not present

## 2020-01-25 DIAGNOSIS — Z20828 Contact with and (suspected) exposure to other viral communicable diseases: Secondary | ICD-10-CM | POA: Diagnosis not present

## 2020-01-25 DIAGNOSIS — U071 COVID-19: Secondary | ICD-10-CM | POA: Diagnosis not present

## 2020-01-29 DIAGNOSIS — Z20828 Contact with and (suspected) exposure to other viral communicable diseases: Secondary | ICD-10-CM | POA: Diagnosis not present

## 2020-01-29 DIAGNOSIS — U071 COVID-19: Secondary | ICD-10-CM | POA: Diagnosis not present

## 2020-02-01 DIAGNOSIS — U071 COVID-19: Secondary | ICD-10-CM | POA: Diagnosis not present

## 2020-02-01 DIAGNOSIS — Z20828 Contact with and (suspected) exposure to other viral communicable diseases: Secondary | ICD-10-CM | POA: Diagnosis not present

## 2020-02-08 DIAGNOSIS — Z20828 Contact with and (suspected) exposure to other viral communicable diseases: Secondary | ICD-10-CM | POA: Diagnosis not present

## 2020-02-08 DIAGNOSIS — U071 COVID-19: Secondary | ICD-10-CM | POA: Diagnosis not present

## 2020-02-22 DIAGNOSIS — U071 COVID-19: Secondary | ICD-10-CM | POA: Diagnosis not present

## 2020-02-22 DIAGNOSIS — Z20828 Contact with and (suspected) exposure to other viral communicable diseases: Secondary | ICD-10-CM | POA: Diagnosis not present

## 2020-02-29 DIAGNOSIS — Z20828 Contact with and (suspected) exposure to other viral communicable diseases: Secondary | ICD-10-CM | POA: Diagnosis not present

## 2020-02-29 DIAGNOSIS — U071 COVID-19: Secondary | ICD-10-CM | POA: Diagnosis not present

## 2020-03-05 DIAGNOSIS — U071 COVID-19: Secondary | ICD-10-CM | POA: Diagnosis not present

## 2020-03-05 DIAGNOSIS — Z20828 Contact with and (suspected) exposure to other viral communicable diseases: Secondary | ICD-10-CM | POA: Diagnosis not present

## 2020-03-07 DIAGNOSIS — U071 COVID-19: Secondary | ICD-10-CM | POA: Diagnosis not present

## 2020-03-07 DIAGNOSIS — Z20828 Contact with and (suspected) exposure to other viral communicable diseases: Secondary | ICD-10-CM | POA: Diagnosis not present

## 2020-03-12 DIAGNOSIS — U071 COVID-19: Secondary | ICD-10-CM | POA: Diagnosis not present

## 2020-03-12 DIAGNOSIS — Z20828 Contact with and (suspected) exposure to other viral communicable diseases: Secondary | ICD-10-CM | POA: Diagnosis not present

## 2020-03-15 ENCOUNTER — Other Ambulatory Visit (HOSPITAL_COMMUNITY)
Admission: RE | Admit: 2020-03-15 | Discharge: 2020-03-15 | Disposition: A | Discharge status: Home / Self Care | Payer: BC Managed Care – PPO | Source: Ambulatory Visit | Attending: Obstetrics & Gynecology | Admitting: Obstetrics & Gynecology

## 2020-03-15 ENCOUNTER — Ambulatory Visit (INDEPENDENT_AMBULATORY_CARE_PROVIDER_SITE_OTHER): Payer: BC Managed Care – PPO | Admitting: Obstetrics & Gynecology

## 2020-03-15 ENCOUNTER — Encounter: Payer: Self-pay | Admitting: Obstetrics & Gynecology

## 2020-03-15 VITALS — BP 120/86 | HR 98 | Ht 61.0 in | Wt 208.5 lb

## 2020-03-15 DIAGNOSIS — Z01419 Encounter for gynecological examination (general) (routine) without abnormal findings: Secondary | ICD-10-CM | POA: Insufficient documentation

## 2020-03-15 DIAGNOSIS — D259 Leiomyoma of uterus, unspecified: Secondary | ICD-10-CM

## 2020-03-15 NOTE — Progress Notes (Signed)
Subjective:     Christina Arroyo is a 49 y.o. female here for a routine exam.  Patient's last menstrual period was 01/26/2020. [id number] Birth Control Method:  BTL Menstrual Calendar(currently): every other month,5-7 days some spotting  Current complaints: none.   Current acute medical issues:  hypertension   Recent Gynecologic History Patient's last menstrual period was 01/26/2020. Last Pap: 2018,  normal Last mammogram: 20121,  normal  Past Medical History:  Diagnosis Date  . COVID-19   . Gall stones   . Hypertension     Past Surgical History:  Procedure Laterality Date  . CHOLECYSTECTOMY N/A 08/23/2013   Procedure: LAPAROSCOPIC CHOLECYSTECTOMY;  Surgeon: Jamesetta So, MD;  Location: [facility];  Service: General;  Laterality: N/A;  . COLONOSCOPY N/A 10/28/2015   Procedure: COLONOSCOPY;  Surgeon: Danie Binder, MD;  Location: [facility];  Service: Endoscopy;  Laterality: N/A;  8:30 Am  . TUBAL LIGATION  08/1997    OB History    Gravida  3   Para  3   Term  3   Preterm      AB      Living  3     SAB      TAB      Ectopic      Multiple      Live Births  3           Social History   Socioeconomic History  . Marital status: Married    Spouse name: Not on file  . Number of children: Not on file  . Years of education: Not on file  . Highest education level: Not on file  Occupational History  . Not on file  Tobacco Use  . Smoking status: Never Smoker  . Smokeless tobacco: Never Used  Vaping Use  . Vaping Use: Never used  Substance and Sexual Activity  . Alcohol use: No  . Drug use: No  . Sexual activity: Yes    Birth control/protection: Surgical    Comment: tubal  Other Topics Concern  . Not on file  Social History Narrative   Entered 11/2014:    Married. 3 children--Ages 17,19,20 y/o.   ----21 y/o still at home--should graduate HS upcoming year.   --Other 2 are in colleg--1 at [employer]. 1 at [employer].       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     Social Determinants of Radio broadcast assistant Strain: Low Risk   . Difficulty of Paying Living Expenses: Not hard at all  Food Insecurity: No Food Insecurity  . Worried About Charity fundraiser in the Last Year: Never true  . Ran Out of Food in the Last Year: Never true  Transportation Needs: No Transportation Needs  . Lack of Transportation (Medical): No  . Lack of Transportation (Non-Medical): No  Physical Activity: Insufficiently Active  . Days of Exercise per Week: 1 day  . Minutes of Exercise per Session: 20 min  Stress: No Stress Concern Present  . Feeling of Stress : Only a little  Social Connections: Socially Integrated  . Frequency of Communication with Friends and Family: More than three times a week  . Frequency of Social Gatherings with Friends and Family: Once a week  . Attends Religious Services: More than 4 times per year  . Active Member of Clubs or Organizations: Yes  . Attends Archivist Meetings: More than  4 times per year  . Marital Status: Married    Family History  Problem Relation Age of Onset  . Hypertension Father   . Cancer Father 31       colon? and prostate  . Diabetes Father   . Pulmonary embolism Mother   . Cancer Paternal Grandfather      Current Outpatient Medications:  .  Cholecalciferol 2000 units CAPS, Take 1 capsule (2,000 Units total) daily by mouth., Disp: 30 each, Rfl: 4 .  lisinopril (ZESTRIL) 20 MG tablet, Take 1 tablet (20 mg total) by mouth daily., Disp: 90 tablet, Rfl: 3 .  ondansetron (ZOFRAN ODT) 4 MG disintegrating tablet, Take 1 tablet (4 mg total) by mouth every 8 (eight) hours as needed for nausea or vomiting., Disp: 20 tablet, Rfl: 0  Review of Systems  Review of Systems  Constitutional: Negative for fever, chills, weight loss, malaise/fatigue and diaphoresis.  HENT: Negative for hearing loss, ear pain, nosebleeds, congestion, sore throat, neck pain,  tinnitus and ear discharge.   Eyes: Negative for blurred vision, double vision, photophobia, pain, discharge and redness.  Respiratory: Negative for cough, hemoptysis, sputum production, shortness of breath, wheezing and stridor.   Cardiovascular: Negative for chest pain, palpitations, orthopnea, claudication, leg swelling and PND.  Gastrointestinal: negative for abdominal pain. Negative for heartburn, nausea, vomiting, diarrhea, constipation, blood in stool and melena.  Genitourinary: Negative for dysuria, urgency, frequency, hematuria and flank pain.  Musculoskeletal: Negative for myalgias, back pain, joint pain and falls.  Skin: Negative for itching and rash.  Neurological: Negative for dizziness, tingling, tremors, sensory change, speech change, focal weakness, seizures, loss of consciousness, weakness and headaches.  Endo/Heme/Allergies: Negative for environmental allergies and polydipsia. Does not bruise/bleed easily.  Psychiatric/Behavioral: Negative for depression, suicidal ideas, hallucinations, memory loss and substance abuse. The patient is not nervous/anxious and does not have insomnia.        Objective:  Blood pressure 120/86, pulse 98, height 5\' 1"  (1.549 m), weight 208 lb 8 oz (94.6 kg), last menstrual period 01/26/2020.   Physical Exam  Vitals reviewed. Constitutional: She is oriented to person, place, and time. She appears well-developed and well-nourished.  HENT:  Head: Normocephalic and atraumatic.        Right Ear: External ear normal.  Left Ear: External ear normal.  Nose: Nose normal.  Mouth/Throat: Oropharynx is clear and moist.  Eyes: Conjunctivae and EOM are normal. Pupils are equal, round, and reactive to light. Right eye exhibits no discharge. Left eye exhibits no discharge. No scleral icterus.  Neck: Normal range of motion. Neck supple. No tracheal deviation present. No thyromegaly present.  Cardiovascular: Normal rate, regular rhythm, normal heart sounds and  intact distal pulses.  Exam reveals no gallop and no friction rub.   No murmur heard. Respiratory: Effort normal and breath sounds normal. No respiratory distress. She has no wheezes. She has no rales. She exhibits no tenderness.  GI: Soft. Bowel sounds are normal. She exhibits no distension and no mass. There is no tenderness. There is no rebound and no guarding.  Genitourinary:  Breasts no masses skin changes or nipple changes bilaterally      Vulva is normal without lesions Vagina is pink moist without discharge Cervix normal in appearance and pap is done Uterus is 16 weeks size Adnexa is negative with normal sized ovaries   Musculoskeletal: Normal range of motion. She exhibits no edema and no tenderness.  Neurological: She is alert and oriented to person, place, and time. She has  normal reflexes. She displays normal reflexes. No cranial nerve deficit. She exhibits normal muscle tone. Coordination normal.  Skin: Skin is warm and dry. No rash noted. No erythema. No pallor.  Psychiatric: She has a normal mood and affect. Her behavior is normal. Judgment and thought content normal.       Medications Ordered at today's visit: No orders of the defined types were placed in this encounter.   Other orders placed at today's visit: No orders of the defined types were placed in this encounter.     Assessment:     enlarged fibroid uterus 16 weeks size Otherwise no problems or complaints Mirena IUD in place.      ICD-10-CM   1. Well woman exam with routine gynecological exam  Z01.419   2. Encounter for gynecological examination with Papanicolaou smear of cervix  Z01.419 Cytology - PAP( [place])  3. Uterine leiomyoma, 16 weeks size  D25.9    Mirena IUD in place 2017, will leave in for 7 years      Plan:    Contraception: tubal ligation. Mammogram ordered. Follow up in: 1 year.   To monitor uterine size  Return in about 1 year (around 03/15/2021) for Follow up.

## 2020-03-18 LAB — CYTOLOGY - PAP
Comment: NEGATIVE
Diagnosis: NEGATIVE
High risk HPV: NEGATIVE

## 2020-03-19 DIAGNOSIS — U071 COVID-19: Secondary | ICD-10-CM | POA: Diagnosis not present

## 2020-03-19 DIAGNOSIS — Z20828 Contact with and (suspected) exposure to other viral communicable diseases: Secondary | ICD-10-CM | POA: Diagnosis not present

## 2020-03-21 DIAGNOSIS — Z20828 Contact with and (suspected) exposure to other viral communicable diseases: Secondary | ICD-10-CM | POA: Diagnosis not present

## 2020-03-21 DIAGNOSIS — U071 COVID-19: Secondary | ICD-10-CM | POA: Diagnosis not present

## 2020-03-26 DIAGNOSIS — Z20828 Contact with and (suspected) exposure to other viral communicable diseases: Secondary | ICD-10-CM | POA: Diagnosis not present

## 2020-03-26 DIAGNOSIS — U071 COVID-19: Secondary | ICD-10-CM | POA: Diagnosis not present

## 2020-03-28 DIAGNOSIS — Z20828 Contact with and (suspected) exposure to other viral communicable diseases: Secondary | ICD-10-CM | POA: Diagnosis not present

## 2020-03-28 DIAGNOSIS — U071 COVID-19: Secondary | ICD-10-CM | POA: Diagnosis not present

## 2020-04-02 DIAGNOSIS — U071 COVID-19: Secondary | ICD-10-CM | POA: Diagnosis not present

## 2020-04-02 DIAGNOSIS — Z20828 Contact with and (suspected) exposure to other viral communicable diseases: Secondary | ICD-10-CM | POA: Diagnosis not present

## 2020-04-04 DIAGNOSIS — U071 COVID-19: Secondary | ICD-10-CM | POA: Diagnosis not present

## 2020-04-04 DIAGNOSIS — Z20828 Contact with and (suspected) exposure to other viral communicable diseases: Secondary | ICD-10-CM | POA: Diagnosis not present

## 2020-04-09 DIAGNOSIS — Z20828 Contact with and (suspected) exposure to other viral communicable diseases: Secondary | ICD-10-CM | POA: Diagnosis not present

## 2020-04-09 DIAGNOSIS — U071 COVID-19: Secondary | ICD-10-CM | POA: Diagnosis not present

## 2020-04-16 DIAGNOSIS — Z20828 Contact with and (suspected) exposure to other viral communicable diseases: Secondary | ICD-10-CM | POA: Diagnosis not present

## 2020-04-16 DIAGNOSIS — U071 COVID-19: Secondary | ICD-10-CM | POA: Diagnosis not present

## 2020-04-18 DIAGNOSIS — U071 COVID-19: Secondary | ICD-10-CM | POA: Diagnosis not present

## 2020-04-18 DIAGNOSIS — Z20828 Contact with and (suspected) exposure to other viral communicable diseases: Secondary | ICD-10-CM | POA: Diagnosis not present

## 2020-04-23 DIAGNOSIS — Z20828 Contact with and (suspected) exposure to other viral communicable diseases: Secondary | ICD-10-CM | POA: Diagnosis not present

## 2020-04-23 DIAGNOSIS — U071 COVID-19: Secondary | ICD-10-CM | POA: Diagnosis not present

## 2020-04-25 DIAGNOSIS — Z20828 Contact with and (suspected) exposure to other viral communicable diseases: Secondary | ICD-10-CM | POA: Diagnosis not present

## 2020-04-25 DIAGNOSIS — U071 COVID-19: Secondary | ICD-10-CM | POA: Diagnosis not present

## 2020-04-30 DIAGNOSIS — Z20828 Contact with and (suspected) exposure to other viral communicable diseases: Secondary | ICD-10-CM | POA: Diagnosis not present

## 2020-04-30 DIAGNOSIS — U071 COVID-19: Secondary | ICD-10-CM | POA: Diagnosis not present

## 2020-05-02 DIAGNOSIS — U071 COVID-19: Secondary | ICD-10-CM | POA: Diagnosis not present

## 2020-05-02 DIAGNOSIS — Z20828 Contact with and (suspected) exposure to other viral communicable diseases: Secondary | ICD-10-CM | POA: Diagnosis not present

## 2020-05-07 DIAGNOSIS — Z20828 Contact with and (suspected) exposure to other viral communicable diseases: Secondary | ICD-10-CM | POA: Diagnosis not present

## 2020-05-07 DIAGNOSIS — U071 COVID-19: Secondary | ICD-10-CM | POA: Diagnosis not present

## 2020-05-09 DIAGNOSIS — Z20828 Contact with and (suspected) exposure to other viral communicable diseases: Secondary | ICD-10-CM | POA: Diagnosis not present

## 2020-05-09 DIAGNOSIS — U071 COVID-19: Secondary | ICD-10-CM | POA: Diagnosis not present

## 2020-05-14 DIAGNOSIS — U071 COVID-19: Secondary | ICD-10-CM | POA: Diagnosis not present

## 2020-05-14 DIAGNOSIS — Z20828 Contact with and (suspected) exposure to other viral communicable diseases: Secondary | ICD-10-CM | POA: Diagnosis not present

## 2020-05-16 DIAGNOSIS — U071 COVID-19: Secondary | ICD-10-CM | POA: Diagnosis not present

## 2020-05-16 DIAGNOSIS — Z20828 Contact with and (suspected) exposure to other viral communicable diseases: Secondary | ICD-10-CM | POA: Diagnosis not present

## 2020-05-21 DIAGNOSIS — U071 COVID-19: Secondary | ICD-10-CM | POA: Diagnosis not present

## 2020-05-21 DIAGNOSIS — Z20828 Contact with and (suspected) exposure to other viral communicable diseases: Secondary | ICD-10-CM | POA: Diagnosis not present

## 2020-05-23 DIAGNOSIS — U071 COVID-19: Secondary | ICD-10-CM | POA: Diagnosis not present

## 2020-05-23 DIAGNOSIS — Z20828 Contact with and (suspected) exposure to other viral communicable diseases: Secondary | ICD-10-CM | POA: Diagnosis not present

## 2020-05-28 DIAGNOSIS — U071 COVID-19: Secondary | ICD-10-CM | POA: Diagnosis not present

## 2020-05-28 DIAGNOSIS — Z20828 Contact with and (suspected) exposure to other viral communicable diseases: Secondary | ICD-10-CM | POA: Diagnosis not present

## 2020-05-30 DIAGNOSIS — U071 COVID-19: Secondary | ICD-10-CM | POA: Diagnosis not present

## 2020-05-30 DIAGNOSIS — Z20828 Contact with and (suspected) exposure to other viral communicable diseases: Secondary | ICD-10-CM | POA: Diagnosis not present

## 2020-06-04 DIAGNOSIS — U071 COVID-19: Secondary | ICD-10-CM | POA: Diagnosis not present

## 2020-06-06 DIAGNOSIS — U071 COVID-19: Secondary | ICD-10-CM | POA: Diagnosis not present

## 2020-06-07 DIAGNOSIS — U071 COVID-19: Secondary | ICD-10-CM | POA: Diagnosis not present

## 2020-06-13 DIAGNOSIS — U071 COVID-19: Secondary | ICD-10-CM | POA: Diagnosis not present

## 2020-06-13 DIAGNOSIS — Z20828 Contact with and (suspected) exposure to other viral communicable diseases: Secondary | ICD-10-CM | POA: Diagnosis not present

## 2020-06-21 ENCOUNTER — Ambulatory Visit: Payer: BC Managed Care – PPO | Admitting: Family Medicine

## 2020-07-10 ENCOUNTER — Other Ambulatory Visit: Payer: Self-pay

## 2020-07-10 ENCOUNTER — Encounter: Payer: Self-pay | Admitting: Family Medicine

## 2020-07-10 ENCOUNTER — Ambulatory Visit: Payer: BC Managed Care – PPO | Admitting: Family Medicine

## 2020-07-10 VITALS — BP 128/78 | HR 80 | Temp 97.9°F | Ht 61.0 in | Wt 211.0 lb

## 2020-07-10 DIAGNOSIS — E119 Type 2 diabetes mellitus without complications: Secondary | ICD-10-CM

## 2020-07-10 DIAGNOSIS — I10 Essential (primary) hypertension: Secondary | ICD-10-CM | POA: Diagnosis not present

## 2020-07-10 DIAGNOSIS — E559 Vitamin D deficiency, unspecified: Secondary | ICD-10-CM | POA: Diagnosis not present

## 2020-07-10 DIAGNOSIS — E669 Obesity, unspecified: Secondary | ICD-10-CM

## 2020-07-10 DIAGNOSIS — R911 Solitary pulmonary nodule: Secondary | ICD-10-CM

## 2020-07-10 MED ORDER — LISINOPRIL 20 MG PO TABS: 20.0000 mg | ORAL_TABLET | Freq: Every day | ORAL | 3 refills | Status: DC

## 2020-07-10 NOTE — Patient Instructions (Addendum)
Change to Christina Arroyo  F/U 6 months for Physical  CT chest to be done, to f/u lung nodule

## 2020-07-10 NOTE — Assessment & Plan Note (Signed)
On vitamin D replacement

## 2020-07-10 NOTE — Assessment & Plan Note (Addendum)
Recheck CT scan , needs a 1 year follow up If stable, likely no further imaging needed

## 2020-07-10 NOTE — Assessment & Plan Note (Addendum)
Diet controlled, recheck A1C  Continue reducing carbs She is going to be restarting physical activity

## 2020-07-10 NOTE — Assessment & Plan Note (Signed)
Controlled, no change to lisinopril

## 2020-07-10 NOTE — Progress Notes (Signed)
   Subjective:    Patient ID: Christina Arroyo, female    DOB: 1970-05-24, 50 y.o.   MRN: [id number]  Patient presents for 6 month follow up  and Hypertension     HTn- taking lisinopril 20mg  once a day , no SE with the medication  she does check BP 007'H systolic before meds at home   No symptoms with exertion, no syncope, no HA  Vitamin D def- taking daily supplement, due for recheck   DM - currently diet controlled, trying to reduce carbs/seets in diet , last A1C 6.0% in August   Obesity- weight up 2lbs in August   She gets COVID testing every 2 weeks  Review Of Systems:  GEN- denies fatigue, fever, weight loss,weakness, recent illness HEENT- denies eye drainage, change in vision, nasal discharge, CVS- denies chest pain, palpitations RESP- denies SOB, cough, wheeze ABD- denies N/V, change in stools, abd pain GU- denies dysuria, hematuria, dribbling, incontinence MSK- denies joint pain, muscle aches, injury Neuro- denies headache, dizziness, syncope, seizure activity       Objective:    BP 128/78   Pulse 80   Temp 97.9 F (36.6 C)   Ht 5\' 1"  (1.549 m)   Wt 211 lb (95.7 kg)   LMP 06/25/2020 (Approximate)   SpO2 96%   BMI 39.87 kg/m  GEN- NAD, alert and oriented x3 HEENT- PERRL, EOMI, non injected sclera, pink conjunctiva Neck- Supple, no thyromegaly CVS- RRR, no murmur RESP-CTAB ABD-NABS,soft,NT,ND EXT- No edema Pulses- Radial, DP- 2+        Assessment & Plan:      Problem List Items Addressed This Visit      Unprioritized   Class 2 obesity   Diabetes mellitus without complication (HCC)    Diet controlled, recheck A1C  Continue reducing carbs She is going to be restarting physical activity        Relevant Orders   Lipid panel   Hemoglobin A1c   Hypertension - Primary    Controlled, no change to lisinopril       Relevant Orders   CBC with Differential/Platelet   Comprehensive metabolic panel   Incidental lung nodule, > 22mm and < 33mm     Recheck CT scan , needs a 1 year follow up If stable, likely no further imaging needed       Relevant Orders   CT Chest Wo Contrast   Vitamin D deficiency    On vitamin D replacement        Relevant Orders   Vitamin D, 25-hydroxy      Note: This dictation was prepared with Dragon dictation along with smaller phrase technology. Any transcriptional errors that result from this process are unintentional.

## 2020-07-11 LAB — COMPREHENSIVE METABOLIC PANEL
AG Ratio: 1.6 (calc) (ref 1.0–2.5)
ALT: 29 U/L (ref 6–29)
AST: 16 U/L (ref 10–35)
Albumin: 4.2 g/dL (ref 3.6–5.1)
Alkaline phosphatase (APISO): 43 U/L (ref 31–125)
BUN: 14 mg/dL (ref 7–25)
CO2: 26 mmol/L (ref 20–32)
Calcium: 9.8 mg/dL (ref 8.6–10.2)
Chloride: 103 mmol/L (ref 98–110)
Creat: 0.73 mg/dL (ref 0.50–1.10)
Globulin: 2.7 g/dL (calc) (ref 1.9–3.7)
Glucose, Bld: 115 mg/dL — ABNORMAL HIGH (ref 65–99)
Potassium: 5.3 mmol/L (ref 3.5–5.3)
Sodium: 137 mmol/L (ref 135–146)
Total Bilirubin: 0.3 mg/dL (ref 0.2–1.2)
Total Protein: 6.9 g/dL (ref 6.1–8.1)

## 2020-07-11 LAB — LIPID PANEL
Cholesterol: 177 mg/dL (ref <200)
HDL: 55 mg/dL (ref >=50)
LDL Cholesterol (Calc): 108 mg/dL (calc) — ABNORMAL HIGH
Non-HDL Cholesterol (Calc): 122 mg/dL (calc) (ref <130)
Total CHOL/HDL Ratio: 3.2 (calc) (ref <5.0)
Triglycerides: 46 mg/dL (ref <150)

## 2020-07-11 LAB — CBC WITH DIFFERENTIAL/PLATELET
Absolute Monocytes: 627 cells/uL (ref 200–950)
Basophils Absolute: 40 cells/uL (ref 0–200)
Basophils Relative: 0.7 %
Eosinophils Absolute: 177 cells/uL (ref 15–500)
Eosinophils Relative: 3.1 %
HCT: 41 % (ref 35.0–45.0)
Hemoglobin: 14 g/dL (ref 11.7–15.5)
Lymphs Abs: 2058 cells/uL (ref 850–3900)
MCH: 30.2 pg (ref 27.0–33.0)
MCHC: 34.1 g/dL (ref 32.0–36.0)
MCV: 88.4 fL (ref 80.0–100.0)
MPV: 9.9 fL (ref 7.5–12.5)
Monocytes Relative: 11 %
Neutro Abs: 2799 cells/uL (ref 1500–7800)
Neutrophils Relative %: 49.1 %
Platelets: 372 10*3/uL (ref 140–400)
RBC: 4.64 10*6/uL (ref 3.80–5.10)
RDW: 12.5 % (ref 11.0–15.0)
Total Lymphocyte: 36.1 %
WBC: 5.7 10*3/uL (ref 3.8–10.8)

## 2020-07-11 LAB — HEMOGLOBIN A1C
Hgb A1c MFr Bld: 6.2 % of total Hgb — ABNORMAL HIGH (ref <5.7)
Mean Plasma Glucose: 131 mg/dL
eAG (mmol/L): 7.3 mmol/L

## 2020-07-11 LAB — VITAMIN D 25 HYDROXY (VIT D DEFICIENCY, FRACTURES): Vit D, 25-Hydroxy: 48 ng/mL (ref 30–100)

## 2020-07-12 ENCOUNTER — Other Ambulatory Visit: Payer: Self-pay | Admitting: *Deleted

## 2020-07-19 ENCOUNTER — Other Ambulatory Visit: Payer: Self-pay | Admitting: *Deleted

## 2020-07-19 ENCOUNTER — Encounter: Payer: Self-pay | Admitting: *Deleted

## 2020-07-19 DIAGNOSIS — R911 Solitary pulmonary nodule: Secondary | ICD-10-CM

## 2020-07-19 MED ORDER — METFORMIN HCL 500 MG PO TABS: 500.0000 mg | ORAL_TABLET | Freq: Every day | ORAL | 3 refills | Status: DC

## 2020-07-22 ENCOUNTER — Ambulatory Visit (HOSPITAL_COMMUNITY): Payer: BC Managed Care – PPO

## 2020-07-22 ENCOUNTER — Other Ambulatory Visit: Payer: Self-pay

## 2020-07-25 ENCOUNTER — Encounter: Payer: Self-pay | Admitting: *Deleted

## 2020-07-25 ENCOUNTER — Other Ambulatory Visit: Payer: Self-pay | Admitting: Oncology

## 2020-07-25 DIAGNOSIS — R911 Solitary pulmonary nodule: Secondary | ICD-10-CM

## 2020-07-25 NOTE — Progress Notes (Signed)
  Pulmonary Nodule Clinic Telephone Note [place]   Received referral from [facility].   HPI: Mrs. Jr is a 50 year old female with past medical history significant for type 2 diabetes, obesity, hypertension and vitamin D deficiency who was seen in the emergency room on 06/01/2019 for dizziness and near syncopal episode.  Work-up included IV fluids and imaging which showed incidentally a 5 mm right upper lobe subpleural nodule.   She has not had her repeat CT scan completed.   Review and Recommendations: I personally reviewed all patient's previous imaging including CT angio chest PE protocol from 06/01/2019.  I recommend follow-up with noncontrast chest CT in the next 1 to 2 weeks.  Social History: Tobacco Use: Low Risk   . Smoking Tobacco Use: Never Smoker  . Smokeless Tobacco Use: Never Used    High risk factors include: History of heavy smoking, exposure to asbestos, radium or uranium, personal family history of lung cancer, older age, sex (females greater than males), race (black and native [place] greater than weight), marginal speculation, upper lobe location, multiplicity (less than 5 nodules increases risk for malignancy) and emphysema and/or pulmonary fibrosis.   This recommendation follows the consensus statement: Guidelines for Management of Incidental Pulmonary Nodules Detected on CT Images: From the Fleischner Society 2017; Radiology 2017; 284:228-243.    I have placed order for CT scan without contrast to be completed in the next 1 to 2 weeks.  Disposition: Order placed for repeat CT chest. Will notify [place] in scheduling. [place] to call patient with appointment date and time. Return to pulmonary nodule clinic a few days after his repeat imaging to discuss results and plan moving forward.  Faythe Casa, NP 07/25/2020 8:33 AM

## 2020-08-07 ENCOUNTER — Encounter: Payer: Self-pay | Admitting: *Deleted

## 2020-08-09 ENCOUNTER — Encounter: Payer: Self-pay | Admitting: *Deleted

## 2020-08-09 ENCOUNTER — Ambulatory Visit: Payer: BC Managed Care – PPO

## 2020-08-09 DIAGNOSIS — R911 Solitary pulmonary nodule: Secondary | ICD-10-CM

## 2020-08-13 ENCOUNTER — Ambulatory Visit
Admission: RE | Admit: 2020-08-13 | Discharge: 2020-08-13 | Disposition: A | Discharge status: Home / Self Care | Payer: BC Managed Care – PPO | Source: Ambulatory Visit | Attending: Oncology | Admitting: Oncology

## 2020-08-13 ENCOUNTER — Ambulatory Visit
Admission: RE | Admit: 2020-08-13 | Discharge: 2020-08-13 | Disposition: A | Discharge status: Home / Self Care | Payer: BC Managed Care – PPO | Attending: Oncology | Admitting: Oncology

## 2020-08-13 DIAGNOSIS — R911 Solitary pulmonary nodule: Secondary | ICD-10-CM | POA: Diagnosis not present

## 2020-08-13 DIAGNOSIS — R918 Other nonspecific abnormal finding of lung field: Secondary | ICD-10-CM | POA: Diagnosis not present

## 2020-08-13 DIAGNOSIS — Z9049 Acquired absence of other specified parts of digestive tract: Secondary | ICD-10-CM | POA: Diagnosis not present

## 2020-08-13 DIAGNOSIS — J40 Bronchitis, not specified as acute or chronic: Secondary | ICD-10-CM | POA: Diagnosis not present

## 2020-08-14 ENCOUNTER — Ambulatory Visit: Payer: BC Managed Care – PPO | Admitting: Oncology

## 2020-08-16 ENCOUNTER — Encounter: Payer: Self-pay | Admitting: Nurse Practitioner

## 2020-08-16 ENCOUNTER — Other Ambulatory Visit: Payer: Self-pay

## 2020-08-16 ENCOUNTER — Ambulatory Visit: Payer: BC Managed Care – PPO | Admitting: Nurse Practitioner

## 2020-08-16 ENCOUNTER — Inpatient Hospital Stay: Payer: BC Managed Care – PPO | Attending: Oncology | Admitting: Oncology

## 2020-08-16 VITALS — BP 110/70 | HR 90 | Temp 97.4°F | Wt 209.4 lb

## 2020-08-16 DIAGNOSIS — R350 Frequency of micturition: Secondary | ICD-10-CM

## 2020-08-16 DIAGNOSIS — E119 Type 2 diabetes mellitus without complications: Secondary | ICD-10-CM | POA: Diagnosis not present

## 2020-08-16 DIAGNOSIS — R911 Solitary pulmonary nodule: Secondary | ICD-10-CM | POA: Diagnosis not present

## 2020-08-16 LAB — URINALYSIS, ROUTINE W REFLEX MICROSCOPIC
Bilirubin Urine: NEGATIVE
Glucose, UA: NEGATIVE
Hyaline Cast: NONE SEEN /LPF
Ketones, ur: NEGATIVE
Nitrite: POSITIVE — AB
Protein, ur: NEGATIVE
Specific Gravity, Urine: 1.025 (ref 1.001–1.03)
pH: 6.5 (ref 5.0–8.0)

## 2020-08-16 LAB — MICROSCOPIC MESSAGE

## 2020-08-16 MED ORDER — METFORMIN HCL ER 500 MG PO TB24
500.0000 mg | ORAL_TABLET | Freq: Every day | ORAL | 3 refills | Status: DC
Start: 2020-08-16 — End: 2020-12-25

## 2020-08-16 MED ORDER — METRONIDAZOLE 0.75 % VA GEL: 1.0000 | Freq: Every day | VAGINAL | 0 refills | Status: DC

## 2020-08-16 MED ORDER — NITROFURANTOIN MONOHYD MACRO 100 MG PO CAPS: 100.0000 mg | ORAL_CAPSULE | Freq: Two times a day (BID) | ORAL | 0 refills | Status: AC

## 2020-08-16 NOTE — Progress Notes (Signed)
Subjective:    Patient ID: Christina Arroyo, female    DOB: September 06, 1970, 50 y.o.   MRN: [id number]  HPI: Christina Arroyo is a 50 y.o. female presenting for possible urinary tract infection.  Chief Complaint  Patient presents with  . Urinary Tract Infection   URINARY SYMPTOMS Feels presusre in bladder Duration: days Dysuria: no Urinary frequency: no Urgency: no Small volume voids: yes Symptom severity: mild Urinary incontinence: no Foul odor: yes Hematuria: no Abdominal pain: no Back pain: yes; in the middle down to lower back Suprapubic pain/pressure: yes Flank pain: no Fever:  No Nausea: yes Vomiting: no Relief with cranberry juice: no Relief with pyridium: not tried Status: fluctuating  Previous urinary tract infection: yes Recurrent urinary tract infection: no - more than 2 years ago Sexual activity: sexually active - does not  History of sexually transmitted disease: no Penile discharge: no Treatments attempted: increasing fluids, cranberry juice  Reports she started Metformin last month and is wondering if this has something to do with her urinary symptoms.  She has been having increased constipation and dizziness as well and stopped taking Metformin for a few days to see if this was the cause.  Her symptoms improved when she was off of this medication.  No Known Allergies  Outpatient Encounter Medications as of 08/16/2020  Medication Sig  . metFORMIN (GLUCOPHAGE-XR) 500 MG 24 hr tablet Take 1 tablet (500 mg total) by mouth daily with breakfast.  . metroNIDAZOLE (METROGEL VAGINAL) 0.75 % vaginal gel Place 1 Applicatorful vaginally at bedtime.  . nitrofurantoin, macrocrystal-monohydrate, (MACROBID) 100 MG capsule Take 1 capsule (100 mg total) by mouth 2 (two) times daily for 5 days.  . Cholecalciferol 2000 units CAPS Take 1 capsule (2,000 Units total) daily by mouth.  Marland Kitchen lisinopril (ZESTRIL) 20 MG tablet Take 1 tablet (20 mg total) by mouth daily.  . ondansetron (ZOFRAN  ODT) 4 MG disintegrating tablet Take 1 tablet (4 mg total) by mouth every 8 (eight) hours as needed for nausea or vomiting. (Patient not taking: Reported on 07/10/2020)  . [DISCONTINUED] metFORMIN (GLUCOPHAGE) 500 MG tablet Take 1 tablet (500 mg total) by mouth daily with breakfast.   No facility-administered encounter medications on file as of 08/16/2020.    Patient Active Problem List   Diagnosis Date Noted  . Incidental lung nodule, > 23mm and < 75mm 06/05/2019  . Ganglion cyst of finger of right hand 01/13/2019  . Diabetes mellitus without complication ([place]) 03/00/9233  . Class 2 obesity 01/13/2019  . Uterine fibroid 03/30/2016  . Family hx of colon cancer   . Chronic constipation 05/15/2015  . Vitamin D deficiency 11/30/2014  . Family history of colon cancer 11/22/2014  . Hypertension     Past Medical History:  Diagnosis Date  . COVID-19   . Gall stones   . Hypertension     Relevant past medical, surgical, family and social history reviewed and updated as indicated. Interim medical history since our last visit reviewed.  Review of Systems Per HPI unless specifically indicated above     Objective:    BP 110/70   Pulse 90   Temp (!) 97.4 F (36.3 C)   Wt 209 lb 6.4 oz (95 kg)   SpO2 98%   BMI 39.57 kg/m   Wt Readings from Last 3 Encounters:  08/16/20 209 lb 6.4 oz (95 kg)  07/10/20 211 lb (95.7 kg)  03/15/20 208 lb 8 oz (94.6 kg)    Physical Exam Vitals  and nursing note reviewed.  Constitutional:      General: She is not in acute distress.    Appearance: Normal appearance. She is obese. She is not toxic-appearing.  Abdominal:     General: Abdomen is flat. Bowel sounds are normal. There is no distension.     Palpations: Abdomen is soft. There is no mass.     Tenderness: There is no abdominal tenderness. There is no right CVA tenderness or left CVA tenderness.  Musculoskeletal:       Back:     Comments: Lumbar spine tender to palpation in the area marked above   Skin:    General: Skin is warm and dry.     Capillary Refill: Capillary refill takes less than 2 seconds.     Coloration: Skin is not jaundiced or pale.     Findings: No erythema.  Neurological:     Mental Status: She is alert and oriented to person, place, and time.  Psychiatric:        Mood and Affect: Mood normal.        Behavior: Behavior normal.        Thought Content: Thought content normal.        Judgment: Judgment normal.     Results for orders placed or performed in visit on 08/16/20  Urinalysis, Routine w reflex microscopic  Result Value Ref Range   Color, Urine YELLOW YELLOW   APPearance CLOUDY (A) CLEAR   Specific Gravity, Urine 1.025 1.001 - 1.03   pH 6.5 5.0 - 8.0   Glucose, UA NEGATIVE NEGATIVE   Bilirubin Urine NEGATIVE NEGATIVE   Ketones, ur NEGATIVE NEGATIVE   Hgb urine dipstick TRACE (A) NEGATIVE   Protein, ur NEGATIVE NEGATIVE   Nitrite POSITIVE (A) NEGATIVE   Leukocytes,Ua TRACE (A) NEGATIVE   WBC, UA 6-10 (A) 0 - 5 /HPF   RBC / HPF 0-2 0 - 2 /HPF   Squamous Epithelial / LPF 0-5 < OR = 5 /HPF   Bacteria, UA MANY (A) NONE SEEN /HPF   Hyaline Cast NONE SEEN NONE SEEN /LPF   Urine-Other    Microscopic Message  Result Value Ref Range   Note        Assessment & Plan:  1. Urination frequency Acute.  No red flags in history or on examination today.  No CVA tenderness.  Urine dipstick today showed trace amount of blood, nitrite positive, and trace amount of leukocyte Estrace.  On microscopic exam, 6-10 white blood cells seen, 0-2 red blood cells seen, many bacteria, and clue cells present.  Will treat for acute UTI with nitrofurantoin 100 mg twice daily for 5 days and send urine for culture to ensure correct antimicrobial has been chosen.  We will also treat for presumed bacterial vaginosis given clue cells are present with MetroGel 1 applicatorful intravaginal every night for 7 days.  Discussed vaginal hygiene-patient is to start urinating after sexual  activity and avoid cranberry juice in the future.  Follow-up if symptoms do not improve.  - Urinalysis, Routine w reflex microscopic - nitrofurantoin, macrocrystal-monohydrate, (MACROBID) 100 MG capsule; Take 1 capsule (100 mg total) by mouth 2 (two) times daily for 5 days.  Dispense: 10 capsule; Refill: 0 - metroNIDAZOLE (METROGEL VAGINAL) 0.75 % vaginal gel; Place 1 Applicatorful vaginally at bedtime.  Dispense: 70 g; Refill: 0 - Urine Culture  2. Diabetes mellitus without complication ([place]) Patient is having difficulty staying compliant with taking immediate release Metformin.  We will switch to extended  release Metformin to help reduce side effects.  Plan to follow-up as scheduled in 4 months.  - metFORMIN (GLUCOPHAGE-XR) 500 MG 24 hr tablet; Take 1 tablet (500 mg total) by mouth daily with breakfast.  Dispense: 90 tablet; Refill: 3    Follow up plan: Return for As scheduled.

## 2020-08-16 NOTE — Progress Notes (Signed)
Pulmonary Nodule Clinic Consult note [facility]  Telephone:([phone] [phone]  Patient Care Team: Eulogio Bear, NP as PCP - General (Nurse Practitioner)   Name of the patient: Christina Arroyo  [id number]  Oct 28, 1970   Date of visit: 08/16/2020   Diagnosis- Lung Nodule  Virtual Visit via Telephone Note   I connected with Christina Arroyo on 08/16/20 at 10am by video enabled telemedicine visit and verified that I am speaking with the correct person using two identifiers.   I discussed the limitations, risks, security and privacy concerns of performing an evaluation and management service by telemedicine and the availability of in-person appointments. I also discussed with the patient that there may be a patient responsible charge related to this service. The patient expressed understanding and agreed to proceed.   Patient's location: Home  Provider's location: Clinic   Chief complaint/ Reason for visit- Pulmonary Nodule Clinic Initial Visit  Past Medical History:  Christina Arroyo is a 50 year old female with past medical history significant for type 2 diabetes, obesity, hypertension and vitamin D deficiency who was seen in the emergency room on 06/01/2019 for dizziness and near syncopal episode.  Work-up included IV fluids and imaging which showed incidentally a 5 mm right upper lobe subpleural nodule.   She has not had her repeat CT scan completed.   Interval history-Christina Arroyo is a 50 year old female who presents today to review her most recent chest x-ray.  We were unfortunately unable to get authorization from her insurance company for a CT scan.  Today, patient reports that she is doing well.  She has never smoked.  Does have history of secondhand smoke exposure secondary to her father who was a heavy smoker.  She denies any known occupational exposures.  She works as an Glass blower/designer.   She did have Covid back in December 2020 right before her CT  scan in January 2021.  She denies any current concerns or pulmonary issues.   She has a family history of colon cancer and lung cancer both from her dad.  He is deceased.  ECOG FS:0 - Asymptomatic  Review of systems- Review of Systems  Constitutional: Negative.  Negative for chills, fever, malaise/fatigue and weight loss.  HENT: Negative for congestion, ear pain and tinnitus.   Eyes: Negative.  Negative for blurred vision and double vision.  Respiratory: Negative.  Negative for cough, sputum production and shortness of breath.   Cardiovascular: Negative.  Negative for chest pain, palpitations and leg swelling.  Gastrointestinal: Negative.  Negative for abdominal pain, constipation, diarrhea, nausea and vomiting.  Genitourinary: Negative for dysuria, frequency and urgency.  Musculoskeletal: Negative for back pain and falls.  Skin: Negative.  Negative for rash.  Neurological: Negative.  Negative for weakness and headaches.  Endo/Heme/Allergies: Negative.  Does not bruise/bleed easily.  Psychiatric/Behavioral: Negative.  Negative for depression. The patient is not nervous/anxious and does not have insomnia.      No Known Allergies   Past Medical History:  Diagnosis Date  . COVID-19   . Gall stones   . Hypertension      Past Surgical History:  Procedure Laterality Date  . CHOLECYSTECTOMY N/A 08/23/2013   Procedure: LAPAROSCOPIC CHOLECYSTECTOMY;  Surgeon: Jamesetta So, MD;  Location: [facility] ORS;  Service: General;  Laterality: N/A;  . COLONOSCOPY N/A 10/28/2015   Procedure: COLONOSCOPY;  Surgeon: Danie Binder, MD;  Location: [facility] ENDO SUITE;  Service: Endoscopy;  Laterality: N/A;  8:30 Am  . TUBAL LIGATION  08/1997    Social History   Socioeconomic History  . Marital status: Married    Spouse name: Not on file  . Number of children: Not on file  . Years of education: Not on file  . Highest education level: Not on file  Occupational History  . Not on file  Tobacco Use  .  Smoking status: Never Smoker  . Smokeless tobacco: Never Used  Vaping Use  . Vaping Use: Never used  Substance and Sexual Activity  . Alcohol use: No  . Drug use: No  . Sexual activity: Yes    Birth control/protection: Surgical    Comment: tubal  Other Topics Concern  . Not on file  Social History Narrative   Entered 11/2014:    Married. 3 children--Ages 17,19,20 y/o.   ----69 y/o still at home--should graduate HS upcoming year.   --Other 2 are in colleg--1 at [employer]. 1 at [employer]       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     Social Determinants of Radio broadcast assistant Strain: Low Risk   . Difficulty of Paying Living Expenses: Not hard at all  Food Insecurity: No Food Insecurity  . Worried About Charity fundraiser in the Last Year: Never true  . Ran Out of Food in the Last Year: Never true  Transportation Needs: No Transportation Needs  . Lack of Transportation (Medical): No  . Lack of Transportation (Non-Medical): No  Physical Activity: Insufficiently Active  . Days of Exercise per Week: 1 day  . Minutes of Exercise per Session: 20 min  Stress: No Stress Concern Present  . Feeling of Stress : Only a little  Social Connections: Socially Integrated  . Frequency of Communication with Friends and Family: More than three times a week  . Frequency of Social Gatherings with Friends and Family: Once a week  . Attends Religious Services: More than 4 times per year  . Active Member of Clubs or Organizations: Yes  . Attends Archivist Meetings: More than 4 times per year  . Marital Status: Married  Human resources officer Violence: Not At Risk  . Fear of Current or Ex-Partner: No  . Emotionally Abused: No  . Physically Abused: No  . Sexually Abused: No    Family History  Problem Relation Age of Onset  . Hypertension Father   . Cancer Father 61       colon? and prostate  . Diabetes Father   . Pulmonary embolism  Mother   . Cancer Paternal Grandfather      Current Outpatient Medications:  .  Cholecalciferol 2000 units CAPS, Take 1 capsule (2,000 Units total) daily by mouth., Disp: 30 each, Rfl: 4 .  lisinopril (ZESTRIL) 20 MG tablet, Take 1 tablet (20 mg total) by mouth daily., Disp: 90 tablet, Rfl: 3 .  metFORMIN (GLUCOPHAGE) 500 MG tablet, Take 1 tablet (500 mg total) by mouth daily with breakfast., Disp: 90 tablet, Rfl: 3 .  ondansetron (ZOFRAN ODT) 4 MG disintegrating tablet, Take 1 tablet (4 mg total) by mouth every 8 (eight) hours as needed for nausea or vomiting. (Patient not taking: Reported on 07/10/2020), Disp: 20 tablet, Rfl: 0  Physical exam: There were no vitals filed for this visit. Physical Exam Constitutional:      Appearance: She is obese.  Neurological:     Mental Status: She is alert and oriented to person, place, and time.  CMP Latest Ref Rng & Units 07/10/2020  Glucose 65 - 99 mg/dL 115(H)  BUN 7 - 25 mg/dL 14  Creatinine 0.50 - 1.10 mg/dL 0.73  Sodium 135 - 146 mmol/L 137  Potassium 3.5 - 5.3 mmol/L 5.3  Chloride 98 - 110 mmol/L 103  CO2 20 - 32 mmol/L 26  Calcium 8.6 - 10.2 mg/dL 9.8  Total Protein 6.1 - 8.1 g/dL 6.9  Total Bilirubin 0.2 - 1.2 mg/dL 0.3  Alkaline Phos 38 - 126 U/L -  AST 10 - 35 U/L 16  ALT 6 - 29 U/L 29   CBC Latest Ref Rng & Units 07/10/2020  WBC 3.8 - 10.8 Thousand/uL 5.7  Hemoglobin 11.7 - 15.5 g/dL 14.0  Hematocrit 35.0 - 45.0 % 41.0  Platelets 140 - 400 Thousand/uL 372    No images are attached to the encounter.  DG Chest 2 View  Result Date: 08/13/2020 CLINICAL DATA:  Follow-up 5 mm right upper lobe subpleural nodule seen on a chest CTA dated 06/01/2019. The patient's insurance denied a CT follow-up. EXAM: CHEST - 2 VIEW COMPARISON:  06/01/2019 and chest CTA dated 06/01/2019. FINDINGS: Normal sized heart. Clear lungs. No radiographically visible lung nodules. Minimal peribronchial thickening. Cholecystectomy clips. Unremarkable  bones. IMPRESSION: 1. No radiographically visible lung nodules. The previously demonstrated 5 mm nodule in the right is not expected to be visible on today's radiographic images due to lack calcification. This would need to be evaluated with a chest CT without contrast to be visualized and compared. 2. Minimal bronchitic changes. Electronically Signed   By: Claudie Revering M.D.   On: 08/13/2020 19:29     Assessment and plan- Patient is a 50 y.o. female who presents to pulmonary nodule clinic for follow-up of incidental lung nodules.   Chest x-ray from 08/13/2020 did not reveal any evidence of lung nodules.  The previously demonstrated 5 mm nodule in the right lung is not expected to be visible on today's imaging due to lack of calcification.  This would need to be evaluated with a chest CT without contrast to be visualized and compared.  Minimal bronchitic changes.  Calculating malignancy probability of a pulmonary nodule: Risk factors include: 1.  Age. 2.  Cancer history. 3.  Diameter of pulmonary nodule and mm 4.  Location 5.  Smoking history 6.  Spiculation present   Based on risk factors, this patient is low risk for the development of lung cancer .  She has never smoked in she had minimal exposure to secondhand smoke.   During our visit, we discussed pulmonary nodules are a common incidental finding and are often how lung cancer is discovered.  Lung cancer survival is directly related to the stage at diagnosis.  We discussed that nodules can vary in presentation from solitary pulmonary nodules to masses, 2 groundglass opacities and multiple nodules.  Pulmonary nodules in the majority of cases are benign but the probability of these becoming malignant cannot be undermined.  Early identification of malignant nodules could lead to early diagnosis and increased survival.   We discussed the probability of pulmonary nodules becoming malignant increase with age, pack years of tobacco use,  size/characteristics of the nodule and location; with upper lobe involvement being most worrisome.   We discussed the goal of our clinic is to thoroughly evaluate each nodule, developed a comprehensive, individualized plan of care utilizing the most advanced technology and significantly reduce the time from detection to treatment.  A dedicated pulmonary nodule clinic has proven to  indeed expedite the detection and treatment of lung cancer.  Incidental findings pulmonary nodule: -Initial scan is from 06/01/2019 which showed a 5 mm right upper lobe subpleural nodule. -She is low risk for the development of lung cancer.  -Unable to get repeat CT chest due to insurance authorization. -Had chest x-ray completed on 08/13/2020 which did not reveal any evidence of a pulmonary nodule but did recommend a CT chest without contrast.  -New order placed for CT chest without contrast and we will get this scheduled in the next week or so. -Patient is agreeable.  Disposition: -I will have her return to clinic after her CT chest to discuss the findings.  Visit Diagnosis 1. Incidental lung nodule, > 7mm and < 3mm     Patient expressed understanding and was in agreement with this plan. She also understands that She can call clinic at any time with any questions, concerns, or complaints.   I provided 10 minutes of face-to-face video visit time during this encounter, and > 50% was spent counseling as documented under my assessment & plan.  Thank you for allowing me to participate in the care of this very pleasant patient.    Jacquelin Hawking, NP [place] [facility] Cell - [phone] Pager- [phone] 08/16/2020 11:07 AM

## 2020-08-18 LAB — URINE CULTURE
MICRO NUMBER:: 11721379
SPECIMEN QUALITY:: ADEQUATE

## 2020-08-19 ENCOUNTER — Encounter: Payer: Self-pay | Admitting: *Deleted

## 2020-08-26 ENCOUNTER — Encounter: Payer: Self-pay | Admitting: Nurse Practitioner

## 2020-08-26 MED ORDER — FLUCONAZOLE 150 MG PO TABS: 150.0000 mg | ORAL_TABLET | Freq: Once | ORAL | 0 refills | Status: AC

## 2020-08-29 ENCOUNTER — Ambulatory Visit
Admission: RE | Admit: 2020-08-29 | Discharge: 2020-08-29 | Disposition: A | Discharge status: Home / Self Care | Payer: BC Managed Care – PPO | Source: Ambulatory Visit | Attending: Oncology | Admitting: Oncology

## 2020-08-29 ENCOUNTER — Other Ambulatory Visit: Payer: Self-pay

## 2020-08-29 DIAGNOSIS — R911 Solitary pulmonary nodule: Secondary | ICD-10-CM

## 2020-08-29 DIAGNOSIS — M47814 Spondylosis without myelopathy or radiculopathy, thoracic region: Secondary | ICD-10-CM | POA: Diagnosis not present

## 2020-08-29 DIAGNOSIS — I700 Atherosclerosis of aorta: Secondary | ICD-10-CM | POA: Diagnosis not present

## 2020-08-29 DIAGNOSIS — I2510 Atherosclerotic heart disease of native coronary artery without angina pectoris: Secondary | ICD-10-CM | POA: Diagnosis not present

## 2020-09-02 ENCOUNTER — Inpatient Hospital Stay (HOSPITAL_BASED_OUTPATIENT_CLINIC_OR_DEPARTMENT_OTHER): Payer: BC Managed Care – PPO | Admitting: Oncology

## 2020-09-02 ENCOUNTER — Encounter: Payer: Self-pay | Admitting: Oncology

## 2020-09-02 ENCOUNTER — Encounter: Payer: Self-pay | Admitting: Nurse Practitioner

## 2020-09-02 DIAGNOSIS — R911 Solitary pulmonary nodule: Secondary | ICD-10-CM

## 2020-09-02 DIAGNOSIS — I700 Atherosclerosis of aorta: Secondary | ICD-10-CM | POA: Insufficient documentation

## 2020-09-02 NOTE — Progress Notes (Signed)
Pulmonary Nodule Clinic Consult note [facility]  [phone] [phone]  Patient Care Team: Eulogio Bear, NP as PCP - General (Nurse Practitioner)   Name of the patient: Christina Arroyo  [id number]  Feb 25, 1971   Date of visit: 09/02/2020   Diagnosis- Lung Nodule  Virtual Visit via Telephone Note   I connected with Christina Arroyo on 09/02/20 at 10am by video enabled telemedicine visit and verified that I am speaking with the correct person using two identifiers.   I discussed the limitations, risks, security and privacy concerns of performing an evaluation and management service by telemedicine and the availability of in-person appointments. I also discussed with the patient that there may be a patient responsible charge related to this service. The patient expressed understanding and agreed to proceed.   Patient's location: Home  Provider's location: Clinic   Chief complaint/ Reason for visit- Pulmonary Nodule Clinic Initial Visit  Past Medical History:  Christina Arroyo is a 50 year old female with past medical history significant for type 2 diabetes, obesity, hypertension and vitamin D deficiency who was seen in the emergency room on 06/01/2019 for dizziness and near syncopal episode.  Work-up included IV fluids and imaging which showed incidentally a 5 mm right upper lobe subpleural nodule.   Due to insurance reason CT scan was unable to be obtained. Chest x-ray from 08/13/2020 did not reveal any evidence of lung nodules.  The previously demonstrated 5 mm nodule in the right lung is not expected to be visible on today's imaging due to lack of calcification.  This would need to be evaluated with a chest CT without contrast to be visualized and compared.  Minimal bronchitic changes.  Interval history-Christina Arroyo is a 50 year old female who presents today to review her most recent CT scan.   Today, patient reports that she is doing well.  She has never  smoked.  Does have history of secondhand smoke exposure secondary to her father who was a heavy smoker.  She denies any known occupational exposures.  She works as an Glass blower/designer.   She did have Covid back in December 2020 right before her CT scan in January 2021.  She denies any current concerns or pulmonary issues.   She has a family history of colon cancer and lung cancer both from her dad.  He is deceased.  ECOG FS:0 - Asymptomatic  Review of systems- Review of Systems  Constitutional: Negative.  Negative for chills, fever, malaise/fatigue and weight loss.  HENT: Negative for congestion, ear pain and tinnitus.   Eyes: Negative.  Negative for blurred vision and double vision.  Respiratory: Negative.  Negative for cough, sputum production and shortness of breath.   Cardiovascular: Negative.  Negative for chest pain, palpitations and leg swelling.  Gastrointestinal: Negative.  Negative for abdominal pain, constipation, diarrhea, nausea and vomiting.  Genitourinary: Negative for dysuria, frequency and urgency.  Musculoskeletal: Negative for back pain and falls.  Skin: Negative.  Negative for rash.  Neurological: Negative.  Negative for weakness and headaches.  Endo/Heme/Allergies: Negative.  Does not bruise/bleed easily.  Psychiatric/Behavioral: Negative.  Negative for depression. The patient is not nervous/anxious and does not have insomnia.      No Known Allergies   Past Medical History:  Diagnosis Date  . COVID-19   . Gall stones   . Hypertension      Past Surgical History:  Procedure Laterality Date  . CHOLECYSTECTOMY N/A 08/23/2013   Procedure: LAPAROSCOPIC CHOLECYSTECTOMY;  Surgeon: Jamesetta So,  MD;  Location: [facility];  Service: General;  Laterality: N/A;  . COLONOSCOPY N/A 10/28/2015   Procedure: COLONOSCOPY;  Surgeon: Danie Binder, MD;  Location: [facility]ITE;  Service: Endoscopy;  Laterality: N/A;  8:30 Am  . TUBAL LIGATION  08/1997    Social History    Socioeconomic History  . Marital status: Married    Spouse name: Not on file  . Number of children: Not on file  . Years of education: Not on file  . Highest education level: Not on file  Occupational History  . Not on file  Tobacco Use  . Smoking status: Never Smoker  . Smokeless tobacco: Never Used  Vaping Use  . Vaping Use: Never used  Substance and Sexual Activity  . Alcohol use: No  . Drug use: No  . Sexual activity: Yes    Birth control/protection: Surgical    Comment: tubal  Other Topics Concern  . Not on file  Social History Narrative   Entered 11/2014:    Married. 3 children--Ages 17,19,20 y/o.   ----53 y/o still at home--should graduate HS upcoming year.   --Other 2 are in colleg--1 at [employer]. 1 at [employer].       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     Social Determinants of Radio broadcast assistant Strain: Low Risk   . Difficulty of Paying Living Expenses: Not hard at all  Food Insecurity: No Food Insecurity  . Worried About Charity fundraiser in the Last Year: Never true  . Ran Out of Food in the Last Year: Never true  Transportation Needs: No Transportation Needs  . Lack of Transportation (Medical): No  . Lack of Transportation (Non-Medical): No  Physical Activity: Insufficiently Active  . Days of Exercise per Week: 1 day  . Minutes of Exercise per Session: 20 min  Stress: No Stress Concern Present  . Feeling of Stress : Only a little  Social Connections: Socially Integrated  . Frequency of Communication with Friends and Family: More than three times a week  . Frequency of Social Gatherings with Friends and Family: Once a week  . Attends Religious Services: More than 4 times per year  . Active Member of Clubs or Organizations: Yes  . Attends Archivist Meetings: More than 4 times per year  . Marital Status: Married  Human resources officer Violence: Not At Risk  . Fear of Current or Ex-Partner:  No  . Emotionally Abused: No  . Physically Abused: No  . Sexually Abused: No    Family History  Problem Relation Age of Onset  . Hypertension Father   . Cancer Father 7       colon? and prostate  . Diabetes Father   . Pulmonary embolism Mother   . Cancer Paternal Grandfather      Current Outpatient Medications:  .  Cholecalciferol 2000 units CAPS, Take 1 capsule (2,000 Units total) daily by mouth., Disp: 30 each, Rfl: 4 .  lisinopril (ZESTRIL) 20 MG tablet, Take 1 tablet (20 mg total) by mouth daily., Disp: 90 tablet, Rfl: 3 .  metFORMIN (GLUCOPHAGE-XR) 500 MG 24 hr tablet, Take 1 tablet (500 mg total) by mouth daily with breakfast. (Patient not taking: Reported on 09/02/2020), Disp: 90 tablet, Rfl: 3 .  metroNIDAZOLE (METROGEL VAGINAL) 0.75 % vaginal gel, Place 1 Applicatorful vaginally at bedtime. (Patient not taking: Reported on 09/02/2020), Disp: 70 g, Rfl: 0 .  ondansetron (ZOFRAN ODT) 4 MG disintegrating tablet, Take 1 tablet (4 mg total) by mouth every 8 (eight) hours as needed for nausea or vomiting. (Patient not taking: No sig reported), Disp: 20 tablet, Rfl: 0  Physical exam: There were no vitals filed for this visit. Physical Exam Constitutional:      Appearance: She is obese.  Neurological:     Mental Status: She is alert and oriented to person, place, and time.      CMP Latest Ref Rng & Units 07/10/2020  Glucose 65 - 99 mg/dL 115(H)  BUN 7 - 25 mg/dL 14  Creatinine 0.50 - 1.10 mg/dL 0.73  Sodium 135 - 146 mmol/L 137  Potassium 3.5 - 5.3 mmol/L 5.3  Chloride 98 - 110 mmol/L 103  CO2 20 - 32 mmol/L 26  Calcium 8.6 - 10.2 mg/dL 9.8  Total Protein 6.1 - 8.1 g/dL 6.9  Total Bilirubin 0.2 - 1.2 mg/dL 0.3  Alkaline Phos 38 - 126 U/L -  AST 10 - 35 U/L 16  ALT 6 - 29 U/L 29   CBC Latest Ref Rng & Units 07/10/2020  WBC 3.8 - 10.8 Thousand/uL 5.7  Hemoglobin 11.7 - 15.5 g/dL 14.0  Hematocrit 35.0 - 45.0 % 41.0  Platelets 140 - 400 Thousand/uL 372    No images  are attached to the encounter.  DG Chest 2 View  Result Date: 08/13/2020 CLINICAL DATA:  Follow-up 5 mm right upper lobe subpleural nodule seen on a chest CTA dated 06/01/2019. The patient's insurance denied a CT follow-up. EXAM: CHEST - 2 VIEW COMPARISON:  06/01/2019 and chest CTA dated 06/01/2019. FINDINGS: Normal sized heart. Clear lungs. No radiographically visible lung nodules. Minimal peribronchial thickening. Cholecystectomy clips. Unremarkable bones. IMPRESSION: 1. No radiographically visible lung nodules. The previously demonstrated 5 mm nodule in the right is not expected to be visible on today's radiographic images due to lack calcification. This would need to be evaluated with a chest CT without contrast to be visualized and compared. 2. Minimal bronchitic changes. Electronically Signed   By: Claudie Revering M.D.   On: 08/13/2020 19:29   CT Chest Wo Contrast  Result Date: 08/31/2020 CLINICAL DATA:  Lung nodule follow up EXAM: CT CHEST WITHOUT CONTRAST TECHNIQUE: Multidetector CT imaging of the chest was performed following the standard protocol without IV contrast. COMPARISON:  06/01/2019 FINDINGS: Cardiovascular: Left anterior descending coronary artery atherosclerotic calcification. Minimal atherosclerotic calcification of the aortic arch. Mediastinum/Nodes: Unremarkable Lungs/Pleura: The right lower lobe subpleural nodule along the major fissure measures 4 by 3 by 2 mm (volume = 10 mm^3) on image 42 series 3, reduced in size compared to the 06/01/2019 exam. Stable 0.2 cm apicoposterior segment left upper lobe nodule on image 20 series 3. Upper Abdomen: Cholecystectomy. Musculoskeletal: Mild thoracic spondylosis. IMPRESSION: 1. The nodule of concern along the major fissure averages 3 mm in size and is reduced in size compared to the 2021 exam. This is a benign subpleural lymph node and requires no further follow. 2. Stable 0.2 cm left upper lobe nodule, benign. No further follow up warranted. 3.  Left anterior descending coronary artery atherosclerosis. Minimal atherosclerotic calcification of the aortic arch. Aortic Atherosclerosis (ICD10-I70.0). Electronically Signed   By: Van Clines M.D.   On: 08/31/2020 14:06     Assessment and plan- Patient is a 50 y.o. female who presents to pulmonary nodule clinic for follow-up of incidental lung nodules.   Repeat CT scan from 08/29/2020 showed a decrease in nodule of concern from 5  mm to 3 mm in size from the 2021 exam.  This is a benign subpleural lymph node and requires no further follow-up.  There is a stable 0.2 cm left upper lobe nodule that is also considered benign.  Left anterior descending coronary artery arthrosclerosis.   Calculating malignancy probability of a pulmonary nodule: Risk factors include: 1.  Age. 2.  Cancer history. 3.  Diameter of pulmonary nodule and mm 4.  Location 5.  Smoking history 6.  Spiculation present   Based on risk factors, this patient is low risk for the development of lung cancer .  She has never smoked in she had minimal exposure to secondhand smoke.   During our visit, we discussed pulmonary nodules are a common incidental finding and are often how lung cancer is discovered.  Lung cancer survival is directly related to the stage at diagnosis.  We discussed that nodules can vary in presentation from solitary pulmonary nodules to masses, 2 groundglass opacities and multiple nodules.  Pulmonary nodules in the majority of cases are benign but the probability of these becoming malignant cannot be undermined.  Early identification of malignant nodules could lead to early diagnosis and increased survival.   We discussed the probability of pulmonary nodules becoming malignant increase with age, pack years of tobacco use, size/characteristics of the nodule and location; with upper lobe involvement being most worrisome.   We discussed the goal of our clinic is to thoroughly evaluate each nodule, developed a  comprehensive, individualized plan of care utilizing the most advanced technology and significantly reduce the time from detection to treatment.  A dedicated pulmonary nodule clinic has proven to indeed expedite the detection and treatment of lung cancer.  Incidental findings pulmonary nodule: -Initial scan is from 06/01/2019 which showed a 5 mm right upper lobe subpleural nodule. -She is low risk for the development of lung cancer.  -Unable to get repeat CT chest due to insurance authorization. -Had chest x-ray completed on 08/13/2020 which did not reveal any evidence of a pulmonary nodule but did recommend a CT chest without contrast.  -Repeat CT chest from 08/29/2020 showed reduction in size of previously noted lung nodule from 5 mm to 3 mm.  This is a benign subpleural lymph node and requires no further follow-up. -Patient does not need any additional follow-up in our clinic.  Disposition: -Note sent to PCP Noemi Chapel, NP. -Patient is discharged from clinic.  Visit Diagnosis No diagnosis found.  Patient expressed understanding and was in agreement with this plan. She also understands that She can call clinic at any time with any questions, concerns, or complaints.   I provided 10 minutes of face-to-face video visit time during this encounter, and > 50% was spent counseling as documented under my assessment & plan.  Thank you for allowing me to participate in the care of this very pleasant patient.    Jacquelin Hawking, NP [place] [facility] Cell - [phone] Pager- [phone] 09/02/2020 11:03 AM

## 2020-09-02 NOTE — Progress Notes (Signed)
Patient called/ pre- screened for virtual appoinment today with oncologist, expresses concerns of numbness.

## 2020-11-12 ENCOUNTER — Encounter: Payer: Self-pay | Admitting: Internal Medicine

## 2020-12-23 NOTE — Progress Notes (Signed)
BP 136/90   Pulse 81   Temp 98.5 F (36.9 C)   Wt 208 lb 3.2 oz (94.4 kg)   SpO2 100%   BMI 39.34 kg/m    Subjective:    Patient ID: Christina Arroyo, female    DOB: [birth year], 50 y.o.   MRN: [id number]  HPI: Christina Arroyo is a 50 y.o. female presenting on 12/25/2020 for comprehensive medical examination. Current medical complaints include: none  She currently lives with: husband, 1 daughter and dog - full of energy  Numbness in right hand; braced self with right hand. Had accident >20 years ago.  Right handed and works Estate agent, mouse.   Dad had MI in 57s.    LMP: end of June; having hot flashes.  For last year, has been more irregular.  Has Mirena - put in in 2017.  Not affecting QOL.  Physical activity - started walking again, trying to do better with diet.  Walking 45-60 minutes over the weekend.   Diet - working on trying to wean down from sugars and fried foods.  Lately has been eating more at home.  Increasing water intake.  Depression Screen done today and results listed below:  Depression screen [facility] 2/9 12/25/2020 07/10/2020 03/15/2020 12/19/2019 08/01/2019  Decreased Interest 0 0 0 0 0  Down, Depressed, Hopeless 0 0 0 0 0  PHQ - 2 Score 0 0 0 0 0  Altered sleeping 1 - 1 - -  Tired, decreased energy 1 - 1 - -  Change in appetite 1 - 1 - -  Feeling bad or failure about yourself  0 - 0 - -  Trouble concentrating 0 - 0 - -  Moving slowly or fidgety/restless 0 - 0 - -  Suicidal thoughts 0 - 0 - -  PHQ-9 Score 3 - 3 - -  Difficult doing work/chores Not difficult at all - - - -   The patient does not have a history of falls. I did not complete a risk assessment for falls. A plan of care for falls was not documented.   Past Medical History:  Past Medical History:  Diagnosis Date   COVID-19    Gall stones    Hypertension     Surgical History:  Past Surgical History:  Procedure Laterality Date   CHOLECYSTECTOMY N/A 08/23/2013   Procedure: LAPAROSCOPIC CHOLECYSTECTOMY;   Surgeon: Jamesetta So, MD;  Location: [facility];  Service: General;  Laterality: N/A;   COLONOSCOPY N/A 10/28/2015   Procedure: COLONOSCOPY;  Surgeon: Danie Binder, MD;  Location: [facility];  Service: Endoscopy;  Laterality: N/A;  8:30 Am   TUBAL LIGATION  08/1997    Medications:  Current Outpatient Medications on File Prior to Visit  Medication Sig   Cholecalciferol 2000 units CAPS Take 1 capsule (2,000 Units total) daily by mouth.   lisinopril (ZESTRIL) 20 MG tablet Take 1 tablet (20 mg total) by mouth daily.   ondansetron (ZOFRAN ODT) 4 MG disintegrating tablet Take 1 tablet (4 mg total) by mouth every 8 (eight) hours as needed for nausea or vomiting. (Patient not taking: No sig reported)   No current facility-administered medications on file prior to visit.    Allergies:  No Known Allergies  Social History:  Social History   Socioeconomic History   Marital status: Married    Spouse name: Not on file   Number of children: Not on file   Years of education: Not on file   Highest education  level: Not on file  Occupational History   Not on file  Tobacco Use   Smoking status: Never   Smokeless tobacco: Never  Vaping Use   Vaping Use: Never used  Substance and Sexual Activity   Alcohol use: No   Drug use: No   Sexual activity: Yes    Birth control/protection: Surgical    Comment: tubal  Other Topics Concern   Not on file  Social History Narrative   Entered 11/2014:    Married. 3 children--Ages 17,19,20 y/o.   ----63 y/o still at home--should graduate HS upcoming year.   --Other 2 are in colleg--1 at [employer]. 1 at [employer].       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     Social Determinants of Radio broadcast assistant Strain: Low Risk    Difficulty of Paying Living Expenses: Not hard at all  Food Insecurity: No Food Insecurity   Worried About Charity fundraiser in the Last Year: Never true   Arboriculturist in  the Last Year: Never true  Transportation Needs: No Transportation Needs   Lack of Transportation (Medical): No   Lack of Transportation (Non-Medical): No  Physical Activity: Insufficiently Active   Days of Exercise per Week: 1 day   Minutes of Exercise per Session: 20 min  Stress: No Stress Concern Present   Feeling of Stress : Only a little  Social Connections: Engineer, building services of Communication with Friends and Family: More than three times a week   Frequency of Social Gatherings with Friends and Family: Once a week   Attends Religious Services: More than 4 times per year   Active Member of [employer] or Organizations: Yes   Attends Music therapist: More than 4 times per year   Marital Status: Married  Human resources officer Violence: Not At Risk   Fear of Current or Ex-Partner: No   Emotionally Abused: No   Physically Abused: No   Sexually Abused: No   Social History   Tobacco Use  Smoking Status Never  Smokeless Tobacco Never   Social History   Substance and Sexual Activity  Alcohol Use No    Family History:  Family History  Problem Relation Age of Onset   Hypertension Father    Cancer Father 2       colon? and prostate   Diabetes Father    Pulmonary embolism Mother    Cancer Paternal Grandfather     Past medical history, surgical history, medications, allergies, family history and social history reviewed with patient today and changes made to appropriate areas of the chart.   Review of Systems  Constitutional: Negative.   HENT: Negative.    Eyes: Negative.   Respiratory: Negative.    Cardiovascular: Negative.   Gastrointestinal: Negative.   Genitourinary: Negative.   Musculoskeletal: Negative.   Skin: Negative.   Neurological: Negative.   Psychiatric/Behavioral: Negative.        Objective:    BP 136/90   Pulse 81   Temp 98.5 F (36.9 C)   Wt 208 lb 3.2 oz (94.4 kg)   SpO2 100%   BMI 39.34 kg/m   Wt Readings from Last 3  Encounters:  12/25/20 208 lb 3.2 oz (94.4 kg)  08/16/20 209 lb 6.4 oz (95 kg)  07/10/20 211 lb (95.7 kg)    Physical Exam Vitals and nursing note reviewed. Exam conducted with a  chaperone present (AW).  Constitutional:      General: She is not in acute distress.    Appearance: Normal appearance. She is obese. She is not ill-appearing or toxic-appearing.  HENT:     Head: Normocephalic and atraumatic.     Right Ear: Tympanic membrane, ear canal and external ear normal.     Left Ear: Tympanic membrane, ear canal and external ear normal.     Nose: Nose normal. No congestion or rhinorrhea.     Mouth/Throat:     Mouth: Mucous membranes are moist.     Pharynx: Oropharynx is clear. No oropharyngeal exudate.  Eyes:     General: No scleral icterus.    Extraocular Movements: Extraocular movements intact.     Pupils: Pupils are equal, round, and reactive to light.  Cardiovascular:     Rate and Rhythm: Normal rate and regular rhythm.     Pulses: Normal pulses.     Heart sounds: Normal heart sounds. No murmur heard. Pulmonary:     Effort: Pulmonary effort is normal. No respiratory distress.     Breath sounds: No wheezing or rhonchi.  Chest:  Breasts:    Right: Normal. No inverted nipple, mass, nipple discharge or skin change.     Left: Normal. No inverted nipple, mass, nipple discharge or skin change.  Abdominal:     General: Abdomen is flat. Bowel sounds are normal. There is no distension.     Palpations: Abdomen is soft.     Tenderness: There is no abdominal tenderness.  Musculoskeletal:        General: No swelling or tenderness. Normal range of motion.     Cervical back: Normal range of motion and neck supple. No rigidity or tenderness.     Right lower leg: No edema.     Left lower leg: No edema.  Skin:    General: Skin is warm and dry.     Capillary Refill: Capillary refill takes less than 2 seconds.     Coloration: Skin is not jaundiced or pale.  Neurological:     General: No  focal deficit present.     Mental Status: She is alert and oriented to person, place, and time.     Motor: No weakness.     Gait: Gait normal.  Psychiatric:        Mood and Affect: Mood normal.        Behavior: Behavior normal.        Thought Content: Thought content normal.        Judgment: Judgment normal.      Assessment & Plan:   Problem List Items Addressed This Visit       Cardiovascular and Mediastinum   Hypertension    Chronic.  Blood pressure is elevated above goal today of less than 130/80.  Reports blood pressure is consistently above goal at home as well.  Will increase lisinopril to 30 mg daily and follow-up in 1 month.  Continue checking blood pressure at home and notify us with readings less than 100/60.       Relevant Medications   atorvastatin (LIPITOR) 10 MG tablet   lisinopril (ZESTRIL) 10 MG tablet   Aortic atherosclerosis (HCC)    Noted on CT scan earlier this year.  Discussed with patient-she is agreeable to start statin today.  We will start atorvastatin 10 mg daily and check lipids today -patient is fasting.  Discussed goal LDL is less than 70.  We will titrate until we get to  goal.  Follow-up in 1 month.       Relevant Medications   atorvastatin (LIPITOR) 10 MG tablet   lisinopril (ZESTRIL) 10 MG tablet     Endocrine   Type 2 diabetes mellitus with obesity (HCC)    Chronic.  Previously diet controlled.  Does not tolerate metformin well.  Discussed other options including GLP-1 today.  Will check A1c today and start treatment as indicated if A1c is greater than 7%.  She is working on dietary and lifestyle changes.  Discussed goal of physical activity 30 minutes 5 times weekly.  We are starting a statin today.  Foot exam done today and normal.  Follow-up in 1 month.       Relevant Medications   atorvastatin (LIPITOR) 10 MG tablet   lisinopril (ZESTRIL) 10 MG tablet   Other Relevant Orders   Hemoglobin A1c   Lipid panel     Other   Vitamin D  deficiency   Relevant Orders   VITAMIN D 25 Hydroxy (Vit-D Deficiency, Fractures)   Other Visit Diagnoses     Annual physical exam    -  Primary   Relevant Orders   TSH   Essential hypertension       Relevant Medications   atorvastatin (LIPITOR) 10 MG tablet   lisinopril (ZESTRIL) 10 MG tablet   Other Relevant Orders   COMPLETE METABOLIC PANEL WITH GFR   CBC with Differential/Platelet   Screening for thyroid disorder       Relevant Orders   TSH   Encounter for screening mammogram for malignant neoplasm of breast       Relevant Orders   MM 3D SCREEN BREAST BILATERAL        Follow up plan: Return in about 4 weeks (around 01/22/2021) for blood pressure, HLD follow up.   LABORATORY TESTING:  - Pap smear: up to date  IMMUNIZATIONS:   - Tdap: Tetanus vaccination status reviewed: last tetanus booster within 10 years. - Influenza: Postponed to flu season - Pneumovax: Not applicable - Prevnar: Not applicable - HPV: Not applicable - Zostavax vaccine: Not applicable - [id number] vaccine: Has received 2 doses with booster-does not have card with her today to document  SCREENING: -Mammogram: Ordered today  - Colonoscopy: Up to date  - Bone Density: Not applicable  -Hearing Test: Not applicable  -Spirometry: Not applicable   PATIENT COUNSELING:   Advised to take 1 mg of folate supplement per day if capable of pregnancy.   Sexuality: Discussed sexually transmitted diseases, partner selection, use of condoms, avoidance of unintended pregnancy  and contraceptive alternatives.   Advised to avoid cigarette smoking.  I discussed with the patient that most people either abstain from alcohol or drink within safe limits (<=14/week and <=4 drinks/occasion for males, <=7/weeks and <= 3 drinks/occasion for females) and that the risk for alcohol disorders and other health effects rises proportionally with the number of drinks per week and how often a drinker exceeds daily  limits.  Discussed cessation/primary prevention of drug use and availability of treatment for abuse.   Diet: Encouraged to adjust caloric intake to maintain  or achieve ideal body weight, to reduce intake of dietary saturated fat and total fat, to limit sodium intake by avoiding high sodium foods and not adding table salt, and to maintain adequate dietary potassium and calcium preferably from fresh fruits, vegetables, and low-fat dairy products.    stressed the importance of regular exercise  Injury prevention: Discussed safety belts, safety helmets, smoke detector,  smoking near bedding or upholstery.   Dental health: Discussed importance of regular tooth brushing, flossing, and dental visits.    NEXT PREVENTATIVE PHYSICAL DUE IN 1 YEAR. Return in about 4 weeks (around 01/22/2021) for blood pressure, HLD follow up.

## 2020-12-25 ENCOUNTER — Other Ambulatory Visit: Payer: Self-pay

## 2020-12-25 ENCOUNTER — Encounter: Payer: Self-pay | Admitting: Nurse Practitioner

## 2020-12-25 ENCOUNTER — Ambulatory Visit (INDEPENDENT_AMBULATORY_CARE_PROVIDER_SITE_OTHER): Payer: BC Managed Care – PPO | Admitting: Nurse Practitioner

## 2020-12-25 VITALS — BP 136/90 | HR 81 | Temp 98.5°F | Ht 61.0 in | Wt 208.2 lb

## 2020-12-25 DIAGNOSIS — E559 Vitamin D deficiency, unspecified: Secondary | ICD-10-CM

## 2020-12-25 DIAGNOSIS — I700 Atherosclerosis of aorta: Secondary | ICD-10-CM

## 2020-12-25 DIAGNOSIS — I10 Essential (primary) hypertension: Secondary | ICD-10-CM

## 2020-12-25 DIAGNOSIS — Z1231 Encounter for screening mammogram for malignant neoplasm of breast: Secondary | ICD-10-CM

## 2020-12-25 DIAGNOSIS — Z0000 Encounter for general adult medical examination without abnormal findings: Secondary | ICD-10-CM

## 2020-12-25 DIAGNOSIS — E119 Type 2 diabetes mellitus without complications: Secondary | ICD-10-CM | POA: Diagnosis not present

## 2020-12-25 DIAGNOSIS — Z1329 Encounter for screening for other suspected endocrine disorder: Secondary | ICD-10-CM

## 2020-12-25 DIAGNOSIS — E1169 Type 2 diabetes mellitus with other specified complication: Secondary | ICD-10-CM

## 2020-12-25 DIAGNOSIS — Z0001 Encounter for general adult medical examination with abnormal findings: Secondary | ICD-10-CM | POA: Diagnosis not present

## 2020-12-25 DIAGNOSIS — E669 Obesity, unspecified: Secondary | ICD-10-CM

## 2020-12-25 MED ORDER — ATORVASTATIN CALCIUM 10 MG PO TABS: 10.0000 mg | ORAL_TABLET | Freq: Every day | ORAL | 1 refills | Status: DC

## 2020-12-25 MED ORDER — LISINOPRIL 10 MG PO TABS: 10.0000 mg | ORAL_TABLET | Freq: Every day | ORAL | 0 refills | Status: DC

## 2020-12-25 NOTE — Patient Instructions (Addendum)
Nice seeing you today!  Start the atorvastatin for cholesterol.  Start taking lisinopril 10 mg with the lisinopril 20 mg to make 30 mg.  BP goal is less than 130/80.  Continue checking BP at home and notify us if less than 100/60.  Call to schedule eye exam.  Send [place] COVID booster date.  We will plan to see you again in 1 month to recheck blood pressure and see how cholesterol medicine is going.

## 2020-12-25 NOTE — Assessment & Plan Note (Signed)
Noted on CT scan earlier this year.  Discussed with patient-she is agreeable to start statin today.  We will start atorvastatin 10 mg daily and check lipids today -patient is fasting.  Discussed goal LDL is less than 70.  We will titrate until we get to goal.  Follow-up in 1 month.

## 2020-12-25 NOTE — Assessment & Plan Note (Signed)
Chronic.  Blood pressure is elevated above goal today of less than 130/80.  Reports blood pressure is consistently above goal at home as well.  Will increase lisinopril to 30 mg daily and follow-up in 1 month.  Continue checking blood pressure at home and notify us with readings less than 100/60.

## 2020-12-25 NOTE — Assessment & Plan Note (Signed)
Chronic.  Previously diet controlled.  Does not tolerate metformin well.  Discussed other options including GLP-1 today.  Will check A1c today and start treatment as indicated if A1c is greater than 7%.  She is working on dietary and lifestyle changes.  Discussed goal of physical activity 30 minutes 5 times weekly.  We are starting a statin today.  Foot exam done today and normal.  Follow-up in 1 month.

## 2020-12-26 LAB — COMPLETE METABOLIC PANEL WITH GFR
AG Ratio: 1.6 (calc) (ref 1.0–2.5)
ALT: 49 U/L — ABNORMAL HIGH (ref 6–29)
AST: 24 U/L (ref 10–35)
Albumin: 4.3 g/dL (ref 3.6–5.1)
Alkaline phosphatase (APISO): 48 U/L (ref 31–125)
BUN: 13 mg/dL (ref 7–25)
CO2: 28 mmol/L (ref 20–32)
Calcium: 9.6 mg/dL (ref 8.6–10.2)
Chloride: 103 mmol/L (ref 98–110)
Creat: 0.75 mg/dL (ref 0.50–0.99)
Globulin: 2.7 g/dL (calc) (ref 1.9–3.7)
Glucose, Bld: 99 mg/dL (ref 65–99)
Potassium: 4.7 mmol/L (ref 3.5–5.3)
Sodium: 137 mmol/L (ref 135–146)
Total Bilirubin: 0.4 mg/dL (ref 0.2–1.2)
Total Protein: 7 g/dL (ref 6.1–8.1)
eGFR: 98 mL/min/{1.73_m2} (ref >=60)

## 2020-12-26 LAB — CBC WITH DIFFERENTIAL/PLATELET
Absolute Monocytes: 490 cells/uL (ref 200–950)
Basophils Absolute: 40 cells/uL (ref 0–200)
Basophils Relative: 0.7 %
Eosinophils Absolute: 200 cells/uL (ref 15–500)
Eosinophils Relative: 3.5 %
HCT: 43.6 % (ref 35.0–45.0)
Hemoglobin: 14 g/dL (ref 11.7–15.5)
Lymphs Abs: 2200 cells/uL (ref 850–3900)
MCH: 28.6 pg (ref 27.0–33.0)
MCHC: 32.1 g/dL (ref 32.0–36.0)
MCV: 89 fL (ref 80.0–100.0)
MPV: 10.4 fL (ref 7.5–12.5)
Monocytes Relative: 8.6 %
Neutro Abs: 2770 cells/uL (ref 1500–7800)
Neutrophils Relative %: 48.6 %
Platelets: 339 10*3/uL (ref 140–400)
RBC: 4.9 10*6/uL (ref 3.80–5.10)
RDW: 12.6 % (ref 11.0–15.0)
Total Lymphocyte: 38.6 %
WBC: 5.7 10*3/uL (ref 3.8–10.8)

## 2020-12-26 LAB — LIPID PANEL
Cholesterol: 158 mg/dL (ref <200)
HDL: 56 mg/dL (ref >=50)
LDL Cholesterol (Calc): 88 mg/dL (calc)
Non-HDL Cholesterol (Calc): 102 mg/dL (calc) (ref <130)
Total CHOL/HDL Ratio: 2.8 (calc) (ref <5.0)
Triglycerides: 48 mg/dL (ref <150)

## 2020-12-26 LAB — HEMOGLOBIN A1C
Hgb A1c MFr Bld: 6.2 % of total Hgb — ABNORMAL HIGH (ref <5.7)
Mean Plasma Glucose: 131 mg/dL
eAG (mmol/L): 7.3 mmol/L

## 2020-12-26 LAB — TSH: TSH: 1.68 mIU/L

## 2020-12-26 LAB — VITAMIN D 25 HYDROXY (VIT D DEFICIENCY, FRACTURES): Vit D, 25-Hydroxy: 57 ng/mL (ref 30–100)

## 2021-01-22 ENCOUNTER — Ambulatory Visit: Payer: BC Managed Care – PPO | Admitting: Nurse Practitioner

## 2021-01-26 NOTE — Progress Notes (Signed)
Subjective:    Patient ID: Christina Arroyo, female    DOB: Aug 11, 1970, 50 y.o.   MRN: [id number]  HPI: Christina Arroyo is a 50 y.o. female presenting for blood pressure follow up.  Chief Complaint  Patient presents with   Follow-up    Follow up for  blood pressure , pt has been checking it at home number have okay , having trouble staying a sleep waiting up around 3 or 4 am   HYPERTENSION/HYPERLIPIDEMIA At last visit, we increased lisinopril to 30 mg.  She is tolerating this well but does report her BP is not yet at goal. Hypertension status: better  Satisfied with current treatment? yes Duration of hypertension: chronic BP monitoring frequency:  daily BP range: 130s/80s BP medication side effects:  no Medication compliance: excellent Aspirin: no Recurrent headaches: no Visual changes: no Palpitations: no Dyspnea: no Chest pain: no Lower extremity edema: no Dizzy/lightheaded: no LDL goal: less than 70  INSOMNIA Reports every morning between 3-4 am, her eyes "pop open" and it is difficult for her to fall back asleep.  Goes to bed between 930 and 11 pm every night and wakes up at the same time every day. Duration: months Satisfied with sleep quality: yes Difficulty falling asleep: no Difficulty staying asleep: yes Waking a few hours after sleep onset: yes Early morning awakenings: yes Daytime hypersomnolence: no Wakes feeling refreshed: no Good sleep hygiene: falls asleep watching tv, has routine Apnea: no Snoring: yes Depressed/anxious mood: no Recent stress: no Restless legs/nocturnal leg cramps: no Chronic pain/arthritis: no History of sleep study: no Treatments attempted: Benadryl, melatonin  No Known Allergies  Outpatient Encounter Medications as of 01/27/2021  Medication Sig Note   atorvastatin (LIPITOR) 10 MG tablet Take 1 tablet (10 mg total) by mouth daily.    Cholecalciferol 2000 units CAPS Take 1 capsule (2,000 Units total) daily by mouth.     hydrochlorothiazide (MICROZIDE) 12.5 MG capsule Take 1 capsule (12.5 mg total) by mouth daily.    lisinopril (ZESTRIL) 10 MG tablet Take 1 tablet (10 mg total) by mouth daily.    lisinopril (ZESTRIL) 20 MG tablet Take 1 tablet (20 mg total) by mouth daily.    traZODone (DESYREL) 50 MG tablet Take 0.5-1 tablets (25-50 mg total) by mouth at bedtime as needed for sleep.    ondansetron (ZOFRAN ODT) 4 MG disintegrating tablet Take 1 tablet (4 mg total) by mouth every 8 (eight) hours as needed for nausea or vomiting. (Patient not taking: No sig reported) 01/27/2021: As needed   No facility-administered encounter medications on file as of 01/27/2021.    Patient Active Problem List   Diagnosis Date Noted   Primary insomnia 01/27/2021   Aortic atherosclerosis ([place]) 09/02/2020   Incidental lung nodule, > 33m and < 836m01/18/2021   Ganglion cyst of finger of right hand 01/13/2019   [place]/28[place]   Class 2 obesity 01/13/2019   Uterine fibroid 03/30/2016   Family hx of colon cancer    Chronic constipation 05/15/2015   Vitamin D deficiency 11/30/2014   Family history of colon cancer 11/22/2014   Hypertension     Past Medical History:  Diagnosis Date   COVID-19    Gall stones    Hypertension     Relevant past medical, surgical, family and social history reviewed and updated as indicated. Interim medical history since our last visit reviewed.  Review of Systems Per HPI unless specifically indicated above  Objective:    BP 138/84 (BP Location: Left Arm, Patient Position: Sitting, Cuff Size: Normal)   Pulse 95   Temp 98.4 F (36.9 C)   Ht 5' (1.524 m)   Wt 209 lb 6.4 oz (95 kg)   SpO2 97%   BMI 40.90 kg/m   Wt Readings from Last 3 Encounters:  01/27/21 209 lb 6.4 oz (95 kg)  12/25/20 208 lb 3.2 oz (94.4 kg)  08/16/20 209 lb 6.4 oz (95 kg)    Physical Exam Vitals and nursing note reviewed.  Constitutional:      General: She is not in  acute distress.    Appearance: Normal appearance. She is obese. She is not toxic-appearing.  HENT:     Head: Normocephalic and atraumatic.  Eyes:     General: No scleral icterus.    Extraocular Movements: Extraocular movements intact.  Cardiovascular:     Heart sounds: Normal heart sounds. No murmur heard. Pulmonary:     Effort: Pulmonary effort is normal. No respiratory distress.     Breath sounds: Normal breath sounds. No wheezing, rhonchi or rales.  Musculoskeletal:     Cervical back: Normal range of motion.  Lymphadenopathy:     Cervical: No cervical adenopathy.  Skin:    General: Skin is warm and dry.     Capillary Refill: Capillary refill takes less than 2 seconds.     Coloration: Skin is not jaundiced or pale.     Findings: No erythema.  Neurological:     Mental Status: She is alert and oriented to person, place, and time.  Psychiatric:        Mood and Affect: Mood normal.        Behavior: Behavior normal.        Thought Content: Thought content normal.        Judgment: Judgment normal.      Assessment & Plan:   Problem List Items Addressed This Visit       Cardiovascular and Mediastinum   Hypertension - Primary    Chronic.  BP remains elevated above goal of less than 130/80.  Will add in HCTZ 12.5 mg daily in the morning and follow up in 1 month to recheck labs - kidney function and electrolytes.  Continuing checking BP at home and notify us if BP less than 100/60.      Relevant Medications   hydrochlorothiazide (MICROZIDE) 12.5 MG capsule     Other   Primary insomnia    Unclear etiology.  Sleep apnea seems unlikely given history, however we can consider if treatment does not provide benefit.  Start low dose trazodone 50 mg nightly.  Follow up in 1 month.      Relevant Medications   traZODone (DESYREL) 50 MG tablet      Follow up plan: Return in about 4 weeks (around 02/24/2021) for BP, insomnia follow up.

## 2021-01-27 ENCOUNTER — Ambulatory Visit: Payer: BC Managed Care – PPO | Admitting: Nurse Practitioner

## 2021-01-27 ENCOUNTER — Other Ambulatory Visit: Payer: Self-pay

## 2021-01-27 ENCOUNTER — Encounter: Payer: Self-pay | Admitting: Nurse Practitioner

## 2021-01-27 VITALS — BP 138/84 | HR 95 | Temp 98.4°F | Ht 60.0 in | Wt 209.4 lb

## 2021-01-27 DIAGNOSIS — I10 Essential (primary) hypertension: Secondary | ICD-10-CM

## 2021-01-27 DIAGNOSIS — F5101 Primary insomnia: Secondary | ICD-10-CM | POA: Diagnosis not present

## 2021-01-27 MED ORDER — TRAZODONE HCL 50 MG PO TABS: 25.0000 mg | ORAL_TABLET | Freq: Every evening | ORAL | 0 refills | Status: AC | PRN

## 2021-01-27 MED ORDER — HYDROCHLOROTHIAZIDE 12.5 MG PO CAPS: 12.5000 mg | ORAL_CAPSULE | Freq: Every day | ORAL | 0 refills | Status: DC

## 2021-01-27 NOTE — Assessment & Plan Note (Signed)
Unclear etiology.  Sleep apnea seems unlikely given history, however we can consider if treatment does not provide benefit.  Start low dose trazodone 50 mg nightly.  Follow up in 1 month.

## 2021-01-27 NOTE — Assessment & Plan Note (Signed)
Chronic.  BP remains elevated above goal of less than 130/80.  Will add in HCTZ 12.5 mg daily in the morning and follow up in 1 month to recheck labs - kidney function and electrolytes.  Continuing checking BP at home and notify us if BP less than 100/60.

## 2021-03-05 ENCOUNTER — Ambulatory Visit (HOSPITAL_COMMUNITY)
Admission: RE | Admit: 2021-03-05 | Discharge: 2021-03-05 | Disposition: A | Discharge status: Home / Self Care | Payer: BC Managed Care – PPO | Source: Ambulatory Visit | Attending: Nurse Practitioner | Admitting: Nurse Practitioner

## 2021-03-05 ENCOUNTER — Other Ambulatory Visit: Payer: Self-pay

## 2021-03-05 DIAGNOSIS — Z1231 Encounter for screening mammogram for malignant neoplasm of breast: Secondary | ICD-10-CM | POA: Diagnosis not present

## 2021-03-06 ENCOUNTER — Encounter: Payer: Self-pay | Admitting: Nurse Practitioner

## 2021-03-06 ENCOUNTER — Ambulatory Visit: Payer: BC Managed Care – PPO | Admitting: Nurse Practitioner

## 2021-03-06 VITALS — BP 128/94 | HR 84 | Temp 98.6°F | Ht 60.0 in | Wt 210.0 lb

## 2021-03-06 DIAGNOSIS — F5101 Primary insomnia: Secondary | ICD-10-CM

## 2021-03-06 DIAGNOSIS — I10 Essential (primary) hypertension: Secondary | ICD-10-CM

## 2021-03-06 DIAGNOSIS — I700 Atherosclerosis of aorta: Secondary | ICD-10-CM | POA: Diagnosis not present

## 2021-03-06 MED ORDER — LISINOPRIL 20 MG PO TABS: 20.0000 mg | ORAL_TABLET | Freq: Every day | ORAL | 3 refills | Status: AC

## 2021-03-06 MED ORDER — ATORVASTATIN CALCIUM 10 MG PO TABS: 10.0000 mg | ORAL_TABLET | Freq: Every day | ORAL | 1 refills | Status: DC

## 2021-03-06 NOTE — Assessment & Plan Note (Addendum)
Tolerating atorvastatin well, continue atorvastatin 10 mg daily.  Refill given.  Will plan to recheck lipids in 6 months.  Goal less than 70 given diabetes.

## 2021-03-06 NOTE — Progress Notes (Signed)
Subjective:    Patient ID: Christina Arroyo, female    DOB: Apr 20, 1971, 50 y.o.   MRN: [id number]  HPI: Christina Arroyo is a 50 y.o. female presenting for blood pressure and insomnia follow up.  Chief Complaint  Patient presents with   Hypertension   HYPERTENSION Taking lisinopril 10 mg daily with HCTZ 12.5 mg daily.  When she started HCTZ with lisinopril 30 mg, she was getting dizzy.  Her blood pressure was running 110s/70s during this time.  She cut back on the lisinopril to allow her body time to adjust to the lower blood pressure. Hypertension status: better  BP monitoring frequency:  daily BP range: 110s/70s when taking the medication Medication compliance: excellent Aspirin: no Recurrent headaches: no Visual changes: no Palpitations: no Dyspnea: no Chest pain: no Lower extremity edema: no Dizzy/lightheaded: yes; when she first started taking HCTZ  INSOMNIA Started Trazodone 25-50 mg nightly at last visit.  It worked very well for her in the beginning.  She is now no longer struggling with her sleep and has not been taking it as much.    No Known Allergies  Outpatient Encounter Medications as of 03/06/2021  Medication Sig Note   Cholecalciferol 2000 units CAPS Take 1 capsule (2,000 Units total) daily by mouth.    hydrochlorothiazide (MICROZIDE) 12.5 MG capsule Take 1 capsule (12.5 mg total) by mouth daily.    lisinopril (ZESTRIL) 10 MG tablet Take 1 tablet (10 mg total) by mouth daily.    [DISCONTINUED] atorvastatin (LIPITOR) 10 MG tablet Take 1 tablet (10 mg total) by mouth daily.    atorvastatin (LIPITOR) 10 MG tablet Take 1 tablet (10 mg total) by mouth daily.    lisinopril (ZESTRIL) 20 MG tablet Take 1 tablet (20 mg total) by mouth daily.    ondansetron (ZOFRAN ODT) 4 MG disintegrating tablet Take 1 tablet (4 mg total) by mouth every 8 (eight) hours as needed for nausea or vomiting. (Patient not taking: No sig reported) 01/27/2021: As needed   traZODone (DESYREL) 50 MG  tablet Take 0.5-1 tablets (25-50 mg total) by mouth at bedtime as needed for sleep.    [DISCONTINUED] lisinopril (ZESTRIL) 20 MG tablet Take 1 tablet (20 mg total) by mouth daily. (Patient not taking: Reported on 03/06/2021)    No facility-administered encounter medications on file as of 03/06/2021.    Patient Active Problem List   Diagnosis Date Noted   Primary insomnia 01/27/2021   Aortic atherosclerosis ([place]) 09/02/2020   Incidental lung nodule, > 36mm and < 39mm 06/05/2019   Ganglion cyst of finger of right hand 01/13/2019   Type 2 diabetes mellitus with obesity ([place]) 01/13/2019   Class 2 obesity 01/13/2019   Uterine fibroid 03/30/2016   Family hx of colon cancer    Chronic constipation 05/15/2015   Vitamin D deficiency 11/30/2014   Family history of colon cancer 11/22/2014   Hypertension     Past Medical History:  Diagnosis Date   COVID-19    Gall stones    Hypertension     Relevant past medical, surgical, family and social history reviewed and updated as indicated. Interim medical history since our last visit reviewed.  Review of Systems Per HPI unless specifically indicated above     Objective:    BP (!) 128/94 (BP Location: Left Arm)   Pulse 84   Temp 98.6 F (37 C)   Ht 5' (1.524 m)   Wt 210 lb (95.3 kg)   SpO2 98%  BMI 41.01 kg/m   Wt Readings from Last 3 Encounters:  03/06/21 210 lb (95.3 kg)  01/27/21 209 lb 6.4 oz (95 kg)  12/25/20 208 lb 3.2 oz (94.4 kg)    Physical Exam Vitals and nursing note reviewed.  Constitutional:      General: She is not in acute distress.    Appearance: Normal appearance. She is not toxic-appearing.  Cardiovascular:     Rate and Rhythm: Normal rate and regular rhythm.     Heart sounds: Normal heart sounds. No murmur heard. Pulmonary:     Effort: Pulmonary effort is normal. No respiratory distress.     Breath sounds: Normal breath sounds. No wheezing, rhonchi or rales.  Musculoskeletal:     Right lower leg: No edema.      Left lower leg: No edema.  Skin:    General: Skin is warm and dry.     Coloration: Skin is not jaundiced or pale.     Findings: No erythema.  Neurological:     Mental Status: She is alert and oriented to person, place, and time.     Motor: No weakness.     Gait: Gait normal.  Psychiatric:        Mood and Affect: Mood normal.        Behavior: Behavior normal.        Thought Content: Thought content normal.        Judgment: Judgment normal.      Assessment & Plan:   Problem List Items Addressed This Visit       Cardiovascular and Mediastinum   Hypertension - Primary    Chronic.  BP elevated above goal today in office, however patient is not taking lisinopril at prescribed dose currently.  I encouraged her to slowly start increasing lisinopril dose; start with 20 mg for 1 week then can increase to 30 mg as long as BP not at goal of less than 130/80.  If BP drops less than 100/60 and patient symptomatic, we can adjust lisinopril as needed. Check BMET today.  Follow up in 6 months.       Relevant Medications   lisinopril (ZESTRIL) 20 MG tablet   atorvastatin (LIPITOR) 10 MG tablet   Other Relevant Orders   BASIC METABOLIC PANEL WITH GFR   Aortic atherosclerosis (H[facility])    Tolerating atorvastatin well, continue atorvastatin 10 mg daily.  Refill given.  Will plan to recheck lipids in 6 months.  Goal less than 70 given diabetes.      Relevant Medications   lisinopril (ZESTRIL) 20 MG tablet   atorvastatin (LIPITOR) 10 MG tablet     Other   Primary insomnia    Improved with Trazodone.  Continue Trazodone as needed.  Refills not needed today.  Follow up in 6 months.         Follow up plan: Return in about 6 months (around 09/04/2021) for follow up.

## 2021-03-06 NOTE — Assessment & Plan Note (Signed)
Chronic.  BP elevated above goal today in office, however patient is not taking lisinopril at prescribed dose currently.  I encouraged her to slowly start increasing lisinopril dose; start with 20 mg for 1 week then can increase to 30 mg as long as BP not at goal of less than 130/80.  If BP drops less than 100/60 and patient symptomatic, we can adjust lisinopril as needed. Check BMET today.  Follow up in 6 months.

## 2021-03-06 NOTE — Assessment & Plan Note (Signed)
Improved with Trazodone.  Continue Trazodone as needed.  Refills not needed today.  Follow up in 6 months.

## 2021-03-07 LAB — BASIC METABOLIC PANEL WITH GFR
BUN: 16 mg/dL (ref 7–25)
CO2: 26 mmol/L (ref 20–32)
Calcium: 9.8 mg/dL (ref 8.6–10.2)
Chloride: 100 mmol/L (ref 98–110)
Creat: 0.75 mg/dL (ref 0.50–0.99)
Glucose, Bld: 116 mg/dL — ABNORMAL HIGH (ref 65–99)
Potassium: 4.2 mmol/L (ref 3.5–5.3)
Sodium: 138 mmol/L (ref 135–146)
eGFR: 98 mL/min/{1.73_m2} (ref >=60)

## 2021-05-08 ENCOUNTER — Other Ambulatory Visit: Payer: Self-pay | Admitting: Nurse Practitioner

## 2021-05-08 DIAGNOSIS — I10 Essential (primary) hypertension: Secondary | ICD-10-CM

## 2021-07-29 DIAGNOSIS — E559 Vitamin D deficiency, unspecified: Secondary | ICD-10-CM | POA: Diagnosis not present

## 2021-07-29 DIAGNOSIS — G4700 Insomnia, unspecified: Secondary | ICD-10-CM | POA: Diagnosis not present

## 2021-07-29 DIAGNOSIS — I10 Essential (primary) hypertension: Secondary | ICD-10-CM | POA: Diagnosis not present

## 2021-07-29 DIAGNOSIS — Z7689 Persons encountering health services in other specified circumstances: Secondary | ICD-10-CM | POA: Diagnosis not present

## 2021-08-06 DIAGNOSIS — I10 Essential (primary) hypertension: Secondary | ICD-10-CM | POA: Diagnosis not present

## 2021-08-06 DIAGNOSIS — E559 Vitamin D deficiency, unspecified: Secondary | ICD-10-CM | POA: Diagnosis not present

## 2021-08-13 DIAGNOSIS — Z0001 Encounter for general adult medical examination with abnormal findings: Secondary | ICD-10-CM | POA: Diagnosis not present

## 2021-08-28 IMAGING — CT CT ANGIO CHEST
2 of 6 series · 18 of 46 positions shown · IV contrast (Omnipaque or Isovue)
Comparison: Chest radiograph dated 06/01/2019.

CLINICAL DATA: 40-year-old female with shortness of breath.

EXAM:
CT ANGIOGRAPHY CHEST WITH CONTRAST
TECHNIQUE: Multidetector CT imaging of the chest was performed using the
standard protocol during bolus administration of intravenous
contrast. Multiplanar CT image reconstructions and MIPs were
obtained to evaluate the vascular anatomy.
CONTRAST:  100mL OMNIPAQUE IOHEXOL 350 MG/ML SOLN

[Series 10: pe axial thins · axial · 0.71mm/px · z∈[-260,-32]mm · 15 of 250 slices shown]
[im 11/250  lung]
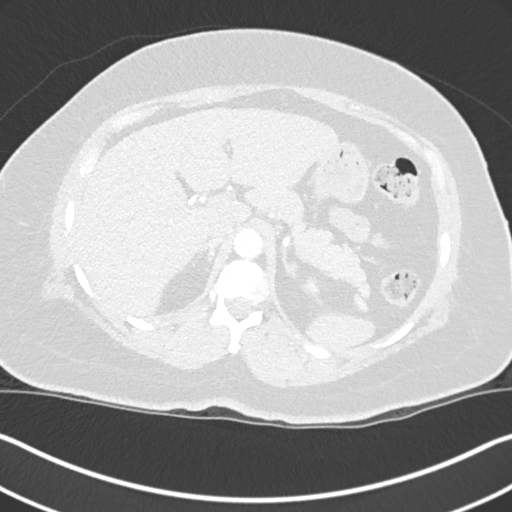
[im 33/250  soft-tissue]
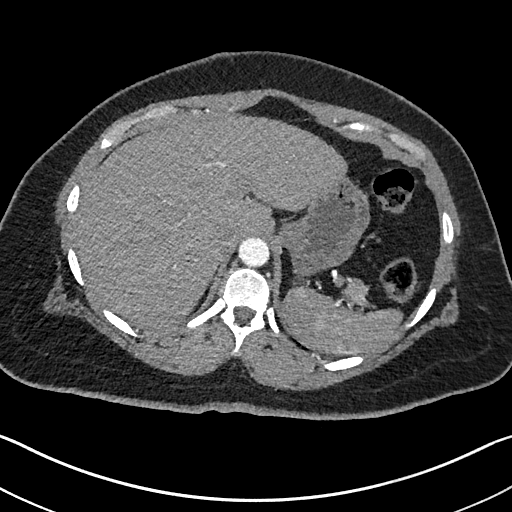
[im 44/250  lung]
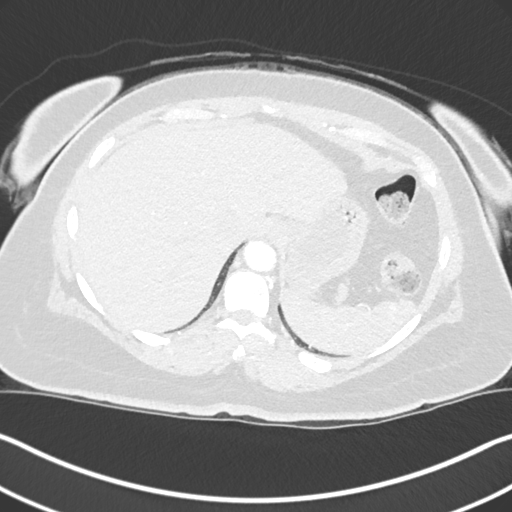
[im 65/250  soft-tissue]
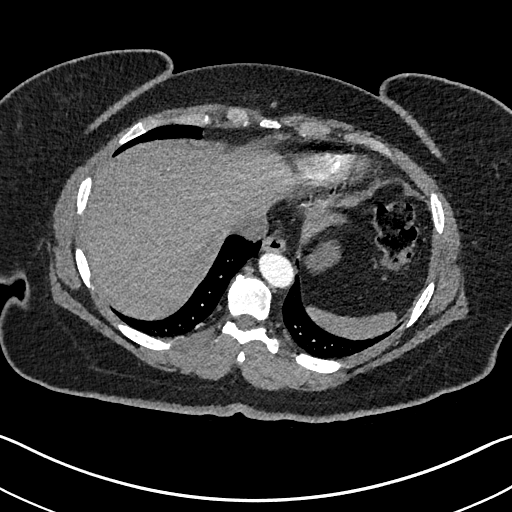
[im 76/250  lung]
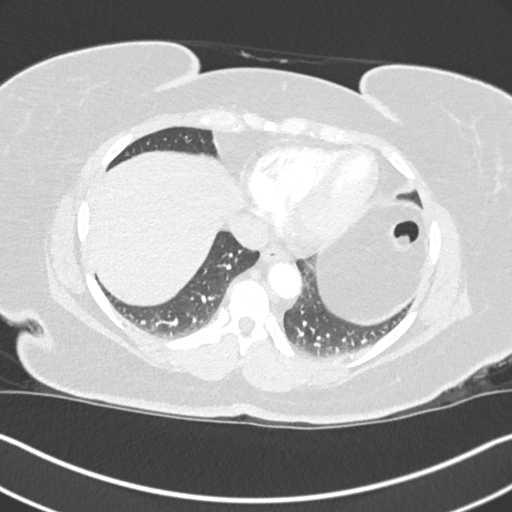
[im 98/250  soft-tissue]
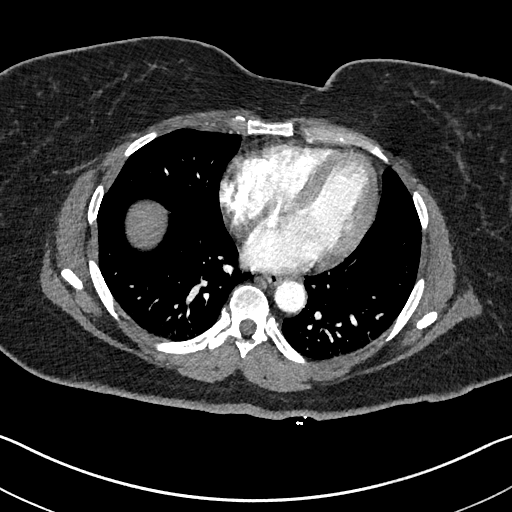
[im 109/250  lung]
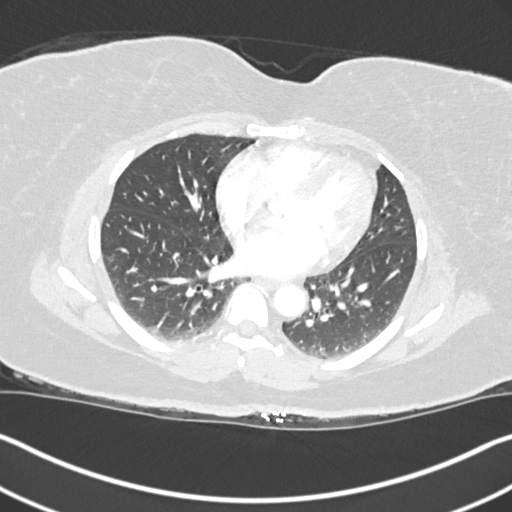
[im 130/250  soft-tissue]
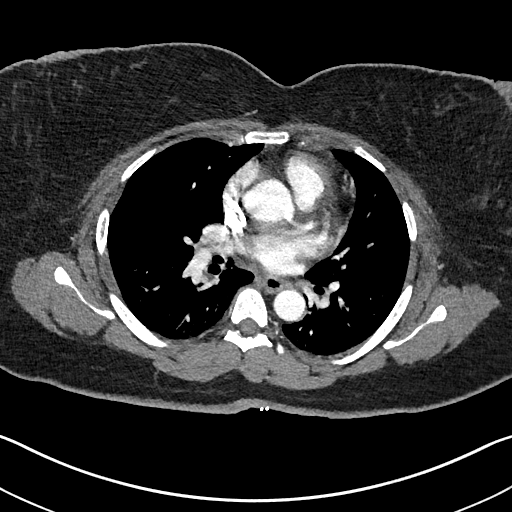
[im 141/250  lung]
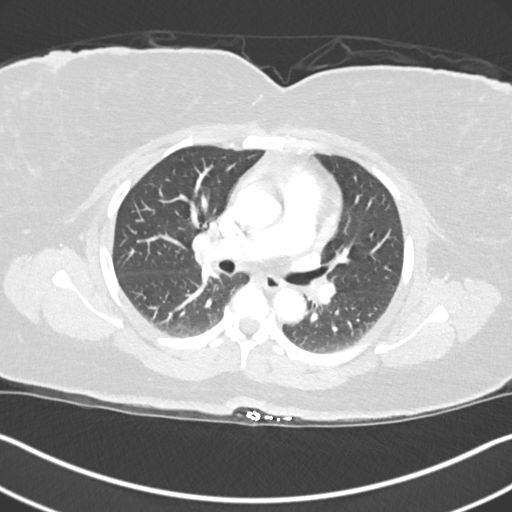
[im 152/250  soft-tissue]
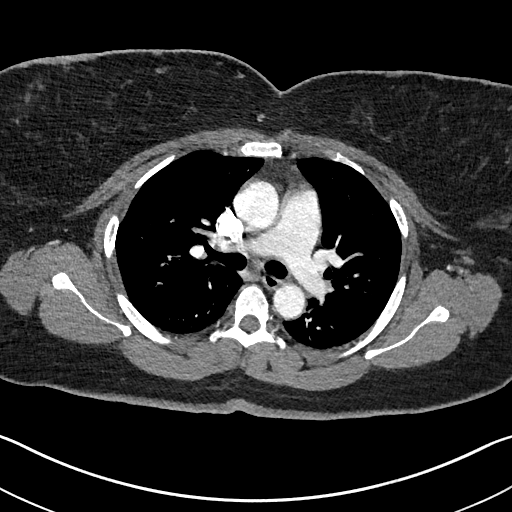
[im 174/250  lung]
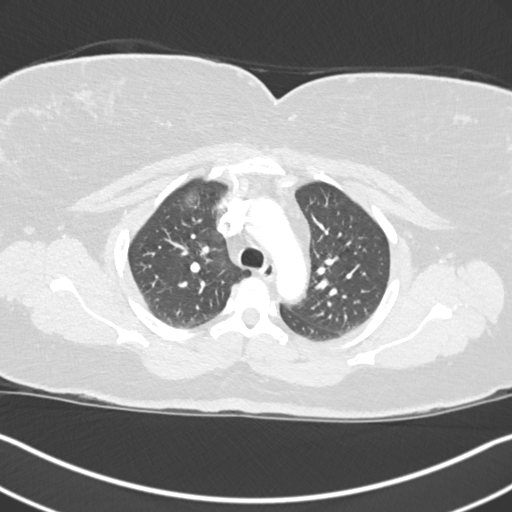
[im 185/250  soft-tissue]
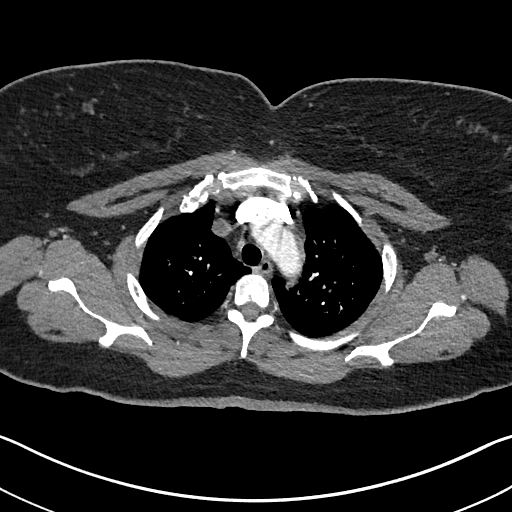
[im 206/250  lung]
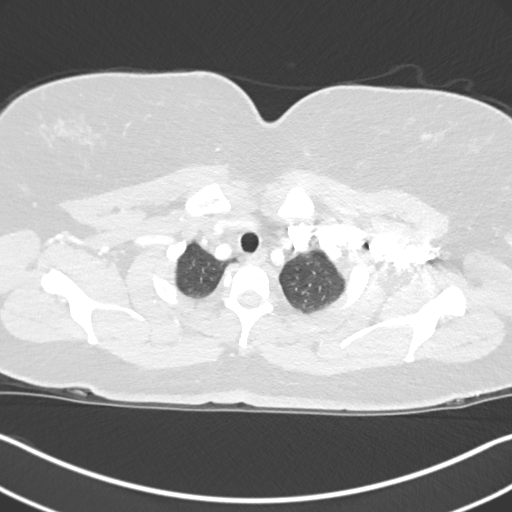
[im 217/250  soft-tissue]
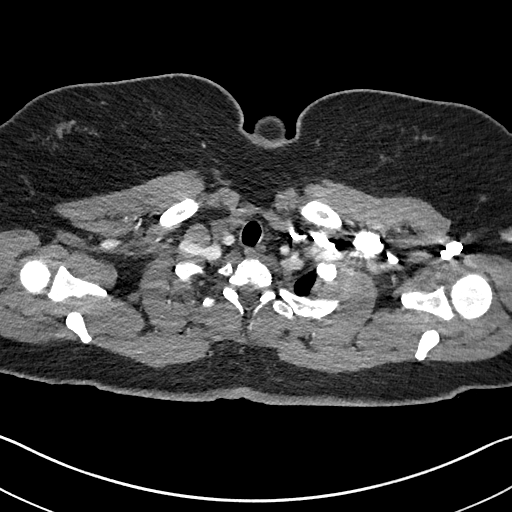
[im 239/250  lung]
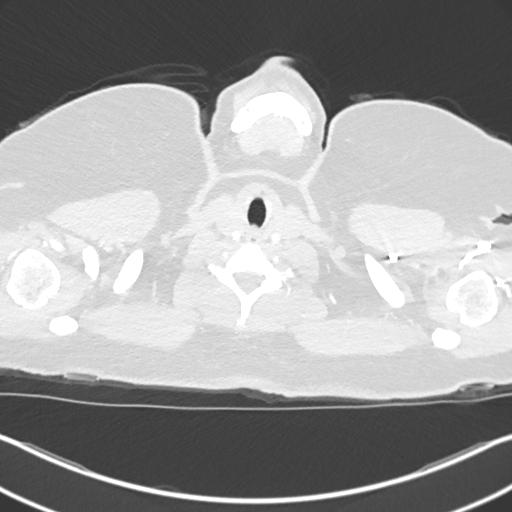

[Series 12: cor soft · coronal · 0.50mm/px · 3 of 122 slices shown]
[im 41/122  soft-tissue]
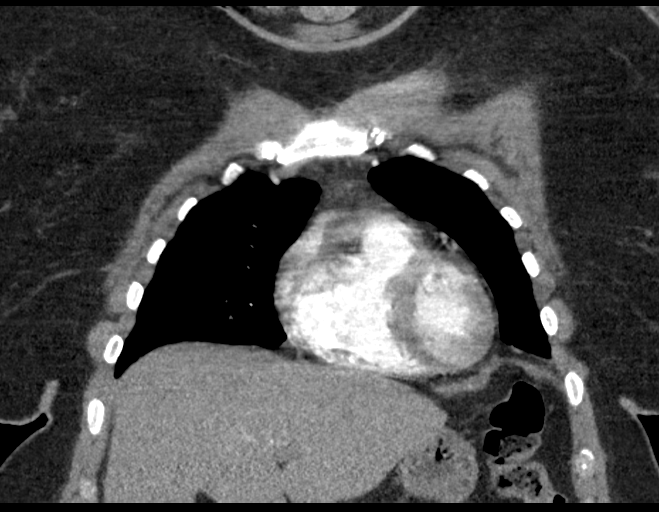
[im 54/122  soft-tissue]
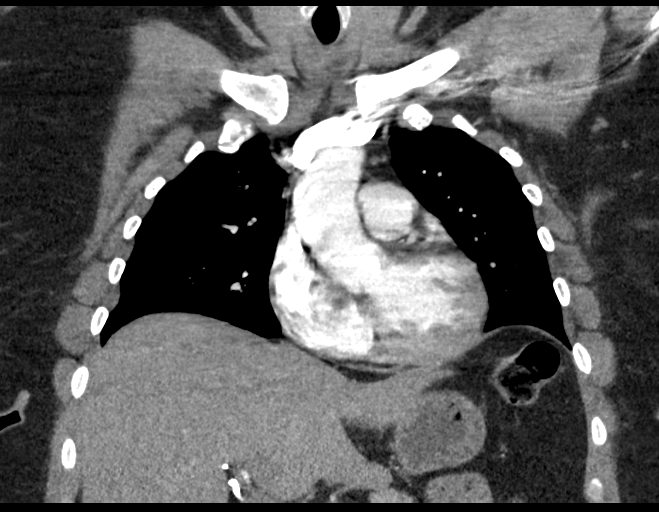
[im 68/122  soft-tissue]
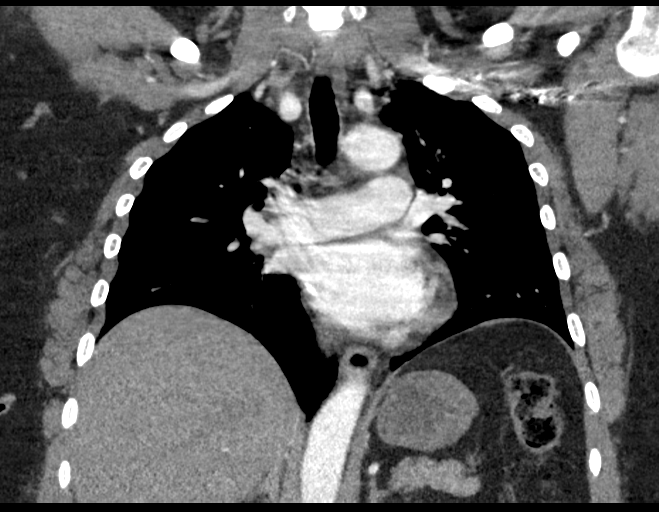

[18 of 46 positions shown; findings below may reference images not displayed]

FINDINGS: Cardiovascular: There is no cardiomegaly or pericardial effusion.
The thoracic aorta is unremarkable. The origins of the great vessels
of the aortic arch appear patent as visualized. Mild dilatation of
the main pulmonary trunk may represent a degree of pulmonary
hypertension. Clinical correlation is recommended. Evaluation of the
pulmonary arteries is limited due to respiratory motion artifact and
suboptimal opacification and timing of the contrast. No large
central pulmonary artery embolus identified.

Mediastinum/Nodes: There is no hilar or mediastinal adenopathy. The
esophagus and the thyroid gland are grossly unremarkable. No
mediastinal fluid collection.

Lungs/Pleura: There is a 5 mm nodule along the superior aspect of
the right major fissure (series 11 image 49). There is no focal
consolidation, pleural effusion, or pneumothorax. The central
airways are patent.

Upper Abdomen: Cholecystectomy.

Musculoskeletal: No chest wall abnormality. No acute or significant
osseous findings.

Review of the MIP images confirms the above findings.
IMPRESSION: 1. No acute intrathoracic pathology. No CT evidence of central
pulmonary artery embolus.
2. A 5 mm right upper lobe subpleural nodule. No follow-up needed if
patient is low-risk. Non-contrast chest CT can be considered in 12
months if patient is high-risk. This recommendation follows the
consensus statement: Guidelines for Management of Incidental
Pulmonary Nodules Detected on CT Images: From the [HOSPITAL]

## 2021-08-28 IMAGING — DX DG CHEST 2V
2 series · 2 of 2 positions shown · non-contrast
Comparison: None.

CLINICAL DATA: Dizziness increased over the last 1-1/2 hours. Near
syncope.

EXAM:
CHEST - 2 VIEW

[chest pa]
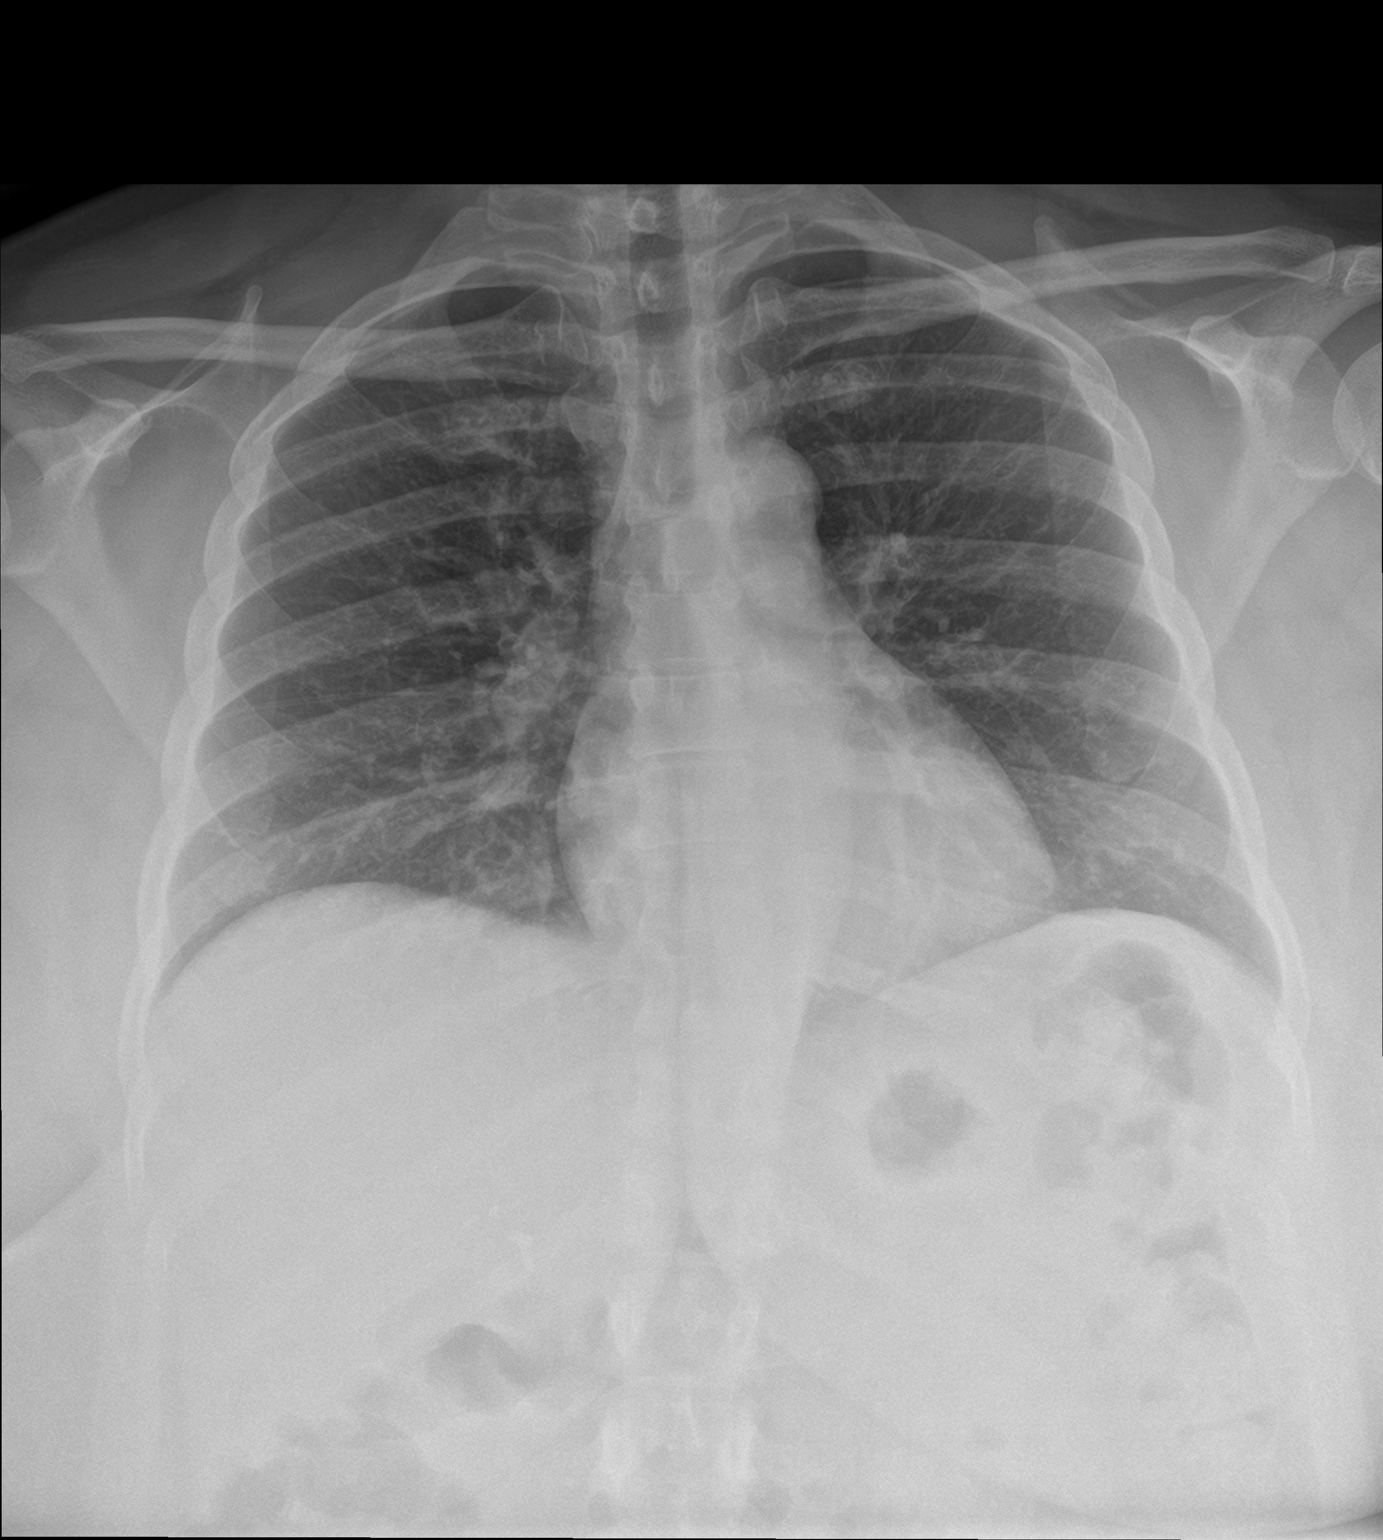

[chest lat]
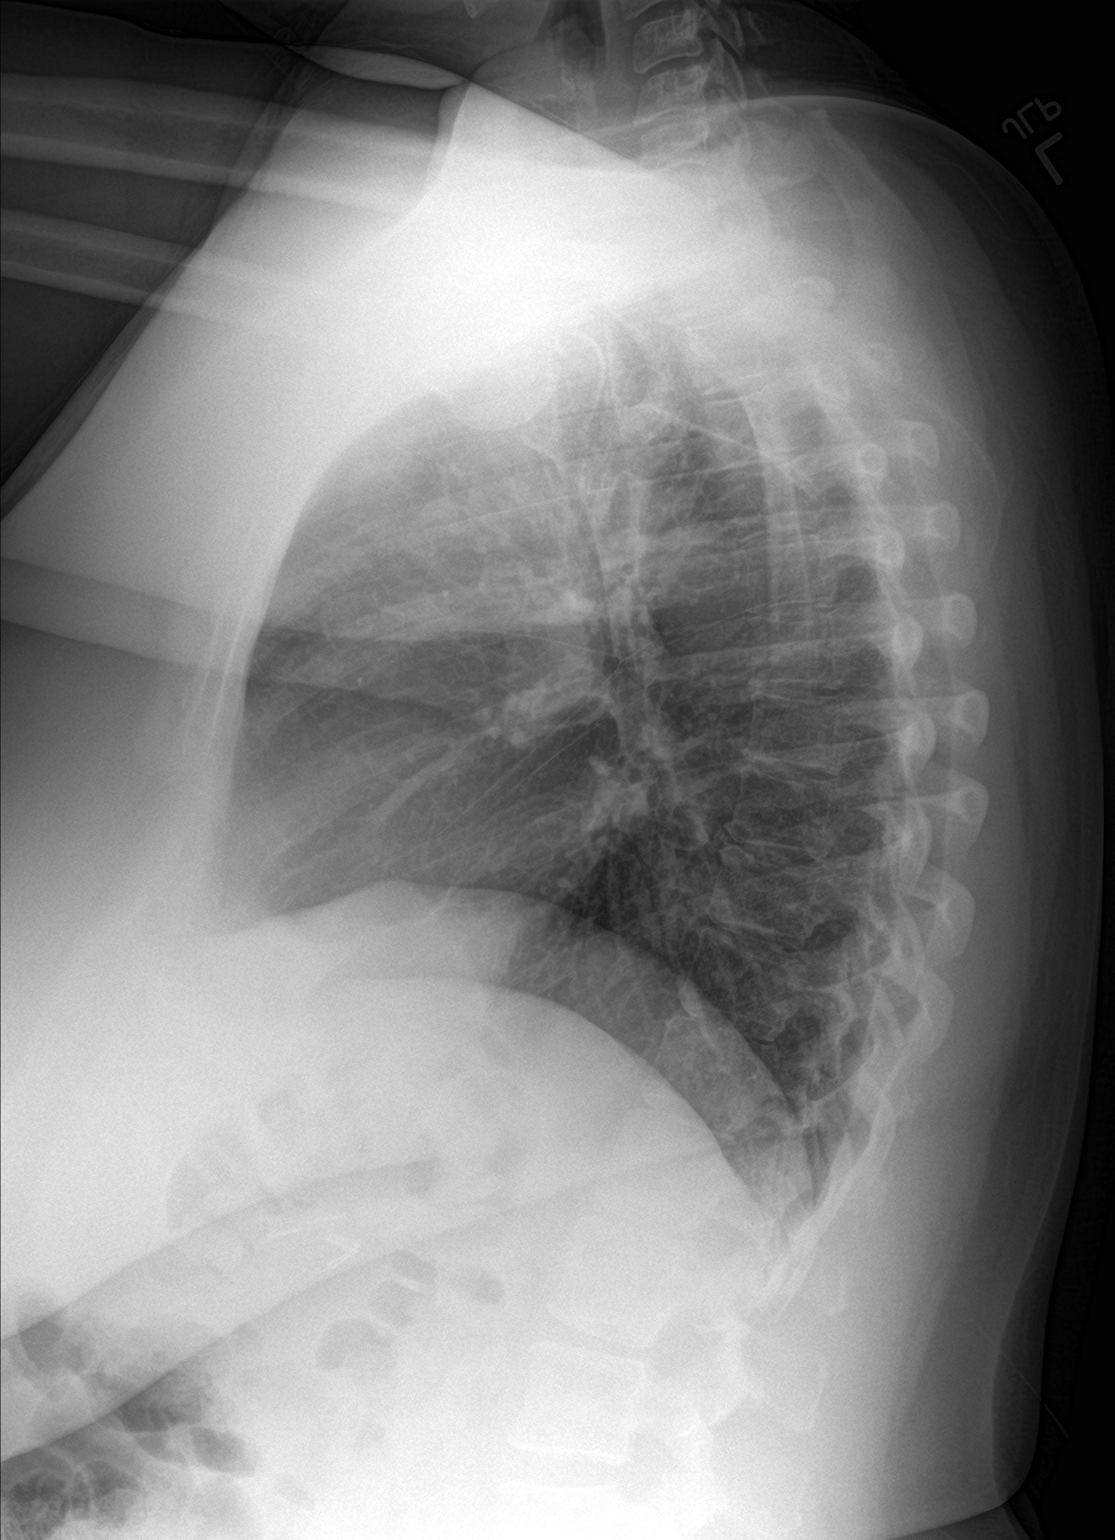

[2 of 2 positions shown; findings below may reference images not displayed]

FINDINGS: The heart size and mediastinal contours are within normal limits.
Both lungs are clear. No pleural effusion or pneumothorax. The
visualized skeletal structures are unremarkable.
IMPRESSION: No active cardiopulmonary disease.

## 2021-09-04 ENCOUNTER — Ambulatory Visit: Payer: BC Managed Care – PPO | Admitting: Nurse Practitioner

## 2021-11-12 DIAGNOSIS — I10 Essential (primary) hypertension: Secondary | ICD-10-CM | POA: Diagnosis not present

## 2021-11-12 DIAGNOSIS — E119 Type 2 diabetes mellitus without complications: Secondary | ICD-10-CM | POA: Diagnosis not present

## 2021-11-12 DIAGNOSIS — E559 Vitamin D deficiency, unspecified: Secondary | ICD-10-CM | POA: Diagnosis not present

## 2021-11-19 DIAGNOSIS — E559 Vitamin D deficiency, unspecified: Secondary | ICD-10-CM | POA: Diagnosis not present

## 2021-11-19 DIAGNOSIS — I10 Essential (primary) hypertension: Secondary | ICD-10-CM | POA: Diagnosis not present

## 2021-11-19 DIAGNOSIS — R000 Tachycardia, unspecified: Secondary | ICD-10-CM | POA: Diagnosis not present

## 2021-11-19 DIAGNOSIS — G4700 Insomnia, unspecified: Secondary | ICD-10-CM | POA: Diagnosis not present

## 2022-04-24 ENCOUNTER — Other Ambulatory Visit: Payer: Self-pay | Admitting: Nurse Practitioner

## 2022-04-24 DIAGNOSIS — I10 Essential (primary) hypertension: Secondary | ICD-10-CM

## 2022-05-15 DIAGNOSIS — I10 Essential (primary) hypertension: Secondary | ICD-10-CM | POA: Diagnosis not present

## 2022-05-15 DIAGNOSIS — E119 Type 2 diabetes mellitus without complications: Secondary | ICD-10-CM | POA: Diagnosis not present

## 2022-05-15 DIAGNOSIS — E559 Vitamin D deficiency, unspecified: Secondary | ICD-10-CM | POA: Diagnosis not present

## 2022-05-20 ENCOUNTER — Other Ambulatory Visit (HOSPITAL_COMMUNITY): Payer: Self-pay | Admitting: Nurse Practitioner

## 2022-05-20 DIAGNOSIS — G4700 Insomnia, unspecified: Secondary | ICD-10-CM | POA: Diagnosis not present

## 2022-05-20 DIAGNOSIS — I10 Essential (primary) hypertension: Secondary | ICD-10-CM | POA: Diagnosis not present

## 2022-05-20 DIAGNOSIS — Z1231 Encounter for screening mammogram for malignant neoplasm of breast: Secondary | ICD-10-CM

## 2022-05-20 DIAGNOSIS — R000 Tachycardia, unspecified: Secondary | ICD-10-CM | POA: Diagnosis not present

## 2022-05-20 DIAGNOSIS — E559 Vitamin D deficiency, unspecified: Secondary | ICD-10-CM | POA: Diagnosis not present

## 2022-05-20 DIAGNOSIS — E119 Type 2 diabetes mellitus without complications: Secondary | ICD-10-CM | POA: Diagnosis not present

## 2022-05-27 ENCOUNTER — Ambulatory Visit (HOSPITAL_COMMUNITY)
Admission: RE | Admit: 2022-05-27 | Discharge: 2022-05-27 | Disposition: A | Discharge status: Home / Self Care | Payer: BC Managed Care – PPO | Source: Ambulatory Visit | Attending: Nurse Practitioner | Admitting: Nurse Practitioner

## 2022-05-27 DIAGNOSIS — Z1231 Encounter for screening mammogram for malignant neoplasm of breast: Secondary | ICD-10-CM

## 2022-06-03 ENCOUNTER — Encounter: Payer: Self-pay | Admitting: *Deleted

## 2022-06-17 ENCOUNTER — Telehealth (INDEPENDENT_AMBULATORY_CARE_PROVIDER_SITE_OTHER): Payer: Self-pay | Admitting: *Deleted

## 2022-06-17 NOTE — Telephone Encounter (Signed)
  Procedure: Colonoscopy  Height: 5'0 Weight: 211        Have you had a colonoscopy before?  2017, Dr. Oneida Alar  Do you have family history of colon cancer?  Father and uncle  Do you have a family history of polyps? no  Previous colonoscopy with polyps removed? no  Do you have a history colorectal cancer?   no  Are you diabetic?  no  Do you have a prosthetic or mechanical heart valve? no  Do you have a pacemaker/defibrillator?   no  Have you had endocarditis/atrial fibrillation?  no  Do you use supplemental oxygen/CPAP?  no  Have you had joint replacement within the last 12 months?  no  Do you tend to be constipated or have to use laxatives?  no   Do you have history of alcohol use? If yes, how much and how often.  no  Do you have history or are you using drugs? If yes, what do are you  using?  no  Have you ever had a stroke/heart attack?  no  Have you ever had a heart or other vascular stent placed,?no  Do you take weight loss medication? no  female patients,: have you had a hysterectomy? no                              are you post menopausal?                                do you still have your menstrual cycle?     Date of last menstrual period? 02/2022  Do you take any blood-thinning medications such as: (Plavix, aspirin, Coumadin, Aggrenox, Brilinta, Xarelto, Eliquis, Pradaxa, Savaysa or Effient)? no  If yes we need the name, milligram, dosage and who is prescribing doctor:               Current Outpatient Medications  Medication Sig Dispense Refill   Cholecalciferol 2000 units CAPS Take 1 capsule (2,000 Units total) daily by mouth. 30 each 4   hydrochlorothiazide (MICROZIDE) 12.5 MG capsule TAKE 1 CAPSULE BY MOUTH ONCE A DAY. 90 capsule 3   levocetirizine (XYZAL) 5 MG tablet Take 5 mg by mouth every evening.     lisinopril (ZESTRIL) 20 MG tablet Take 1 tablet (20 mg total) by mouth daily. 90 tablet 3   traZODone (DESYREL) 50 MG tablet Take 0.5-1 tablets  (25-50 mg total) by mouth at bedtime as needed for sleep. (Patient taking differently: Take 50 mg by mouth at bedtime.) 30 tablet 0   No current facility-administered medications for this visit.    No Known Allergies   [employer]

## 2022-06-24 NOTE — Telephone Encounter (Signed)
[facility]TR[facility]

## 2022-06-24 NOTE — Telephone Encounter (Signed)
2017 colonoscopy: slightly redundant colon, mild diverticulosis, hemorrhoids.  ASA 3 due to BMI.

## 2022-06-29 NOTE — Telephone Encounter (Signed)
Pt had left vm to schedule procedure.  [place]

## 2022-06-29 NOTE — Telephone Encounter (Signed)
Pt left message returning call. Pt stated to call her at [phone].

## 2022-06-29 NOTE — Telephone Encounter (Signed)
Attempted to call pt, no answer. 

## 2022-06-30 ENCOUNTER — Encounter: Payer: Self-pay | Admitting: *Deleted

## 2022-06-30 ENCOUNTER — Other Ambulatory Visit: Payer: Self-pay | Admitting: *Deleted

## 2022-06-30 MED ORDER — PEG 3350-KCL-NA BICARB-NACL 420 G PO SOLR: 4000.0000 mL | Freq: Once | ORAL | 0 refills | Status: AC

## 2022-06-30 NOTE — Telephone Encounter (Signed)
Pt has been scheduled for 07/27/22. Instructions sent to pt via MyChart and prep sent to the pharmacy.

## 2022-07-22 NOTE — Patient Instructions (Signed)
Christina Arroyo  07/22/2022     '@PREFPERIOPPHARMACY'$ @   Your procedure is scheduled on  07/27/2022.   Report to [facility] at  [place].M.   Call this number if you have problems the morning of surgery:  [phone]  If you experience any cold or flu symptoms such as cough, fever, chills, shortness of breath, etc. between now and your scheduled surgery, please notify us at the above number.   Remember:  Follow the diet and prep instructions given to you by the office.     Take these medicines the morning of surgery with A SIP OF WATER                                              xyzal.     Do not wear jewelry, make-up or nail polish.  Do not wear lotions, powders, or perfumes, or deodorant.  Do not shave 48 hours prior to surgery.  Men may shave face and neck.  Do not bring valuables to the hospital.  [facility] is not responsible for any belongings or valuables.  Contacts, dentures or bridgework may not be worn into surgery.  Leave your suitcase in the car.  After surgery it may be brought to your room.  For patients admitted to the hospital, discharge time will be determined by your treatment team.  Patients discharged the day of surgery will not be allowed to drive home and must have someone with them for 24 hours.    Special instructions:   DO NOT smoke tobacco or vape for 24 hours before your procedure.  Please read over the following fact sheets that you were given. Anesthesia Post-op Instructions and Care and Recovery After Surgery      Colonoscopy, Adult, Care After The following information offers guidance on how to care for yourself after your procedure. Your health care provider may also give you more specific instructions. If you have problems or questions, contact your health care provider. What can I expect after the procedure? After the procedure, it is common to have: A small amount of blood in your stool for 24 hours after the procedure. Some  gas. Mild cramping or bloating of your abdomen. Follow these instructions at home: Eating and drinking  Drink enough fluid to keep your urine pale yellow. Follow instructions from your health care provider about eating or drinking restrictions. Resume your normal diet as told by your health care provider. Avoid heavy or fried foods that are hard to digest. Activity Rest as told by your health care provider. Avoid sitting for a long time without moving. Get up to take short walks every 1-2 hours. This is important to improve blood flow and breathing. Ask for help if you feel weak or unsteady. Return to your normal activities as told by your health care provider. Ask your health care provider what activities are safe for you. Managing cramping and bloating  Try walking around when you have cramps or feel bloated. If directed, apply heat to your abdomen as told by your health care provider. Use the heat source that your health care provider recommends, such as a moist heat pack or a heating pad. Place a towel between your skin and the heat source. Leave the heat on for 20-30 minutes. Remove the heat if your skin turns bright  red. This is especially important if you are unable to feel pain, heat, or cold. You have a greater risk of getting burned. General instructions If you were given a sedative during the procedure, it can affect you for several hours. Do not drive or operate machinery until your health care provider says that it is safe. For the first 24 hours after the procedure: Do not sign important documents. Do not drink alcohol. Do your regular daily activities at a slower pace than normal. Eat soft foods that are easy to digest. Take over-the-counter and prescription medicines only as told by your health care provider. Keep all follow-up visits. This is important. Contact a health care provider if: You have blood in your stool 2-3 days after the procedure. Get help right away  if: You have more than a small spotting of blood in your stool. You have large blood clots in your stool. You have swelling of your abdomen. You have nausea or vomiting. You have a fever. You have increasing pain in your abdomen that is not relieved with medicine. These symptoms may be an emergency. Get help right away. Call 911. Do not wait to see if the symptoms will go away. Do not drive yourself to the hospital. Summary After the procedure, it is common to have a small amount of blood in your stool. You may also have mild cramping and bloating of your abdomen. If you were given a sedative during the procedure, it can affect you for several hours. Do not drive or operate machinery until your health care provider says that it is safe. Get help right away if you have a lot of blood in your stool, nausea or vomiting, a fever, or increased pain in your abdomen. This information is not intended to replace advice given to you by your health care provider. Make sure you discuss any questions you have with your health care provider. Document Revised: 12/25/2020 Document Reviewed: 12/25/2020 Elsevier Patient Education  [place] After The following information offers guidance on how to care for yourself after your procedure. Your health care provider may also give you more specific instructions. If you have problems or questions, contact your health care provider. What can I expect after the procedure? After the procedure, it is common to have: Tiredness. Little or no memory about what happened during or after the procedure. Impaired judgment when it comes to making decisions. Nausea or vomiting. Some trouble with balance. Follow these instructions at home: For the time period you were told by your health care provider:  Rest. Do not participate in activities where you could fall or become injured. Do not drive or use machinery. Do not drink  alcohol. Do not take sleeping pills or medicines that cause drowsiness. Do not make important decisions or sign legal documents. Do not take care of children on your own. Medicines Take over-the-counter and prescription medicines only as told by your health care provider. If you were prescribed antibiotics, take them as told by your health care provider. Do not stop using the antibiotic even if you start to feel better. Eating and drinking Follow instructions from your health care provider about what you may eat and drink. Drink enough fluid to keep your urine pale yellow. If you vomit: Drink clear fluids slowly and in small amounts as you are able. Clear fluids include water, ice chips, low-calorie sports drinks, and fruit juice that has water added to it (diluted fruit juice). Eat light  and bland foods in small amounts as you are able. These foods include bananas, applesauce, rice, lean meats, toast, and crackers. General instructions  Have a responsible adult stay with you for the time you are told. It is important to have someone help care for you until you are awake and alert. If you have sleep apnea, surgery and some medicines can increase your risk for breathing problems. Follow instructions from your health care provider about wearing your sleep device: When you are sleeping. This includes during daytime naps. While taking prescription pain medicines, sleeping medicines, or medicines that make you drowsy. Do not use any products that contain nicotine or tobacco. These products include cigarettes, chewing tobacco, and vaping devices, such as e-cigarettes. If you need help quitting, ask your health care provider. Contact a health care provider if: You feel nauseous or vomit every time you eat or drink. You feel light-headed. You are still sleepy or having trouble with balance after 24 hours. You get a rash. You have a fever. You have redness or swelling around the IV site. Get help  right away if: You have trouble breathing. You have new confusion after you get home. These symptoms may be an emergency. Get help right away. Call 911. Do not wait to see if the symptoms will go away. Do not drive yourself to the hospital. This information is not intended to replace advice given to you by your health care provider. Make sure you discuss any questions you have with your health care provider. Document Revised: 09/29/2021 Document Reviewed: 09/29/2021 Elsevier Patient Education  [place].

## 2022-07-23 ENCOUNTER — Encounter (HOSPITAL_COMMUNITY): Payer: Self-pay

## 2022-07-23 ENCOUNTER — Encounter (HOSPITAL_COMMUNITY)
Admission: RE | Admit: 2022-07-23 | Discharge: 2022-07-23 | Disposition: A | Discharge status: Home / Self Care | Payer: BC Managed Care – PPO | Source: Ambulatory Visit | Attending: Internal Medicine | Admitting: Internal Medicine

## 2022-07-23 VITALS — BP 139/86 | HR 107 | Temp 97.5°F | Resp 18 | Ht 60.0 in | Wt 210.1 lb

## 2022-07-23 DIAGNOSIS — R000 Tachycardia, unspecified: Secondary | ICD-10-CM | POA: Diagnosis not present

## 2022-07-23 DIAGNOSIS — I10 Essential (primary) hypertension: Secondary | ICD-10-CM | POA: Diagnosis not present

## 2022-07-23 DIAGNOSIS — Z01818 Encounter for other preprocedural examination: Secondary | ICD-10-CM | POA: Insufficient documentation

## 2022-07-23 DIAGNOSIS — E669 Obesity, unspecified: Secondary | ICD-10-CM | POA: Insufficient documentation

## 2022-07-23 DIAGNOSIS — E1169 Type 2 diabetes mellitus with other specified complication: Secondary | ICD-10-CM | POA: Diagnosis not present

## 2022-07-23 HISTORY — DX: Prediabetes: R73.03

## 2022-07-23 HISTORY — DX: Other specified postprocedural states: Z98.890

## 2022-07-23 LAB — BASIC METABOLIC PANEL
Anion gap: 10 (ref 5–15)
BUN: 9 mg/dL (ref 6–20)
CO2: 24 mmol/L (ref 22–32)
Calcium: 9 mg/dL (ref 8.9–10.3)
Chloride: 99 mmol/L (ref 98–111)
Creatinine, Ser: 0.75 mg/dL (ref 0.44–1.00)
GFR, Estimated: >60 mL/min (ref >60)
Glucose, Bld: 124 mg/dL — ABNORMAL HIGH (ref 70–99)
Potassium: 3.8 mmol/L (ref 3.5–5.1)
Sodium: 133 mmol/L — ABNORMAL LOW (ref 135–145)

## 2022-07-23 LAB — POCT PREGNANCY, URINE: Preg Test, Ur: NEGATIVE

## 2022-07-27 ENCOUNTER — Ambulatory Visit (HOSPITAL_COMMUNITY): Payer: BC Managed Care – PPO | Admitting: Anesthesiology

## 2022-07-27 ENCOUNTER — Encounter (HOSPITAL_COMMUNITY)
Admission: RE | Disposition: A | Discharge status: Home / Self Care | Payer: Self-pay | Source: Home / Self Care | Attending: Internal Medicine

## 2022-07-27 ENCOUNTER — Ambulatory Visit (HOSPITAL_COMMUNITY)
Admission: RE | Admit: 2022-07-27 | Discharge: 2022-07-27 | Disposition: A | Discharge status: Home / Self Care | Payer: BC Managed Care – PPO | Attending: Internal Medicine | Admitting: Internal Medicine

## 2022-07-27 ENCOUNTER — Encounter (HOSPITAL_COMMUNITY): Payer: Self-pay

## 2022-07-27 DIAGNOSIS — Z6841 Body Mass Index (BMI) 40.0 and over, adult: Secondary | ICD-10-CM | POA: Diagnosis not present

## 2022-07-27 DIAGNOSIS — Z79899 Other long term (current) drug therapy: Secondary | ICD-10-CM | POA: Diagnosis not present

## 2022-07-27 DIAGNOSIS — K5730 Diverticulosis of large intestine without perforation or abscess without bleeding: Secondary | ICD-10-CM

## 2022-07-27 DIAGNOSIS — Z1211 Encounter for screening for malignant neoplasm of colon: Secondary | ICD-10-CM

## 2022-07-27 DIAGNOSIS — K648 Other hemorrhoids: Secondary | ICD-10-CM | POA: Diagnosis not present

## 2022-07-27 DIAGNOSIS — E119 Type 2 diabetes mellitus without complications: Secondary | ICD-10-CM | POA: Insufficient documentation

## 2022-07-27 DIAGNOSIS — I10 Essential (primary) hypertension: Secondary | ICD-10-CM | POA: Insufficient documentation

## 2022-07-27 DIAGNOSIS — Z800 Family history of malignant neoplasm of digestive organs: Secondary | ICD-10-CM | POA: Diagnosis not present

## 2022-07-27 HISTORY — PX: COLONOSCOPY WITH PROPOFOL: SHX5780

## 2022-07-27 LAB — GLUCOSE, CAPILLARY: Glucose-Capillary: 130 mg/dL — ABNORMAL HIGH (ref 70–99)

## 2022-07-27 SURGERY — COLONOSCOPY WITH PROPOFOL
Anesthesia: General

## 2022-07-27 MED ORDER — LIDOCAINE HCL (CARDIAC) PF 100 MG/5ML IV SOSY
PREFILLED_SYRINGE | INTRAVENOUS | Status: DC | PRN
  Administered 2022-07-27: 50 mg via INTRATRACHEAL

## 2022-07-27 MED ORDER — STERILE WATER FOR IRRIGATION IR SOLN
Status: DC | PRN
  Administered 2022-07-27: .6 mL

## 2022-07-27 MED ORDER — PROPOFOL 10 MG/ML IV BOLUS
INTRAVENOUS | Status: DC | PRN
  Administered 2022-07-27: 100 mg via INTRAVENOUS

## 2022-07-27 MED ORDER — PROPOFOL 500 MG/50ML IV EMUL
INTRAVENOUS | Status: DC | PRN
  Administered 2022-07-27: 150 ug/kg/min via INTRAVENOUS

## 2022-07-27 NOTE — Anesthesia Preprocedure Evaluation (Signed)
Anesthesia Evaluation  Patient identified by MRN, date of birth, ID band Patient awake    Reviewed: Allergy & Precautions, H&P , NPO status , Patient's Chart, lab work & pertinent test results, reviewed documented beta blocker date and time   History of Anesthesia Complications (+) PONV and history of anesthetic complications  Airway Mallampati: II  TM Distance: >3 FB Neck ROM: full    Dental no notable dental hx.    Pulmonary neg pulmonary ROS   Pulmonary exam normal breath sounds clear to auscultation       Cardiovascular Exercise Tolerance: Good hypertension, negative cardio ROS  Rhythm:regular Rate:Normal     Neuro/Psych negative neurological ROS  negative psych ROS   GI/Hepatic negative GI ROS, Neg liver ROS,,,  Endo/Other  diabetes, Type 2  Morbid obesity  Renal/GU negative Renal ROS  negative genitourinary   Musculoskeletal   Abdominal   Peds  Hematology negative hematology ROS (+)   Anesthesia Other Findings   Reproductive/Obstetrics negative OB ROS                             Anesthesia Physical Anesthesia Plan  ASA: 3  Anesthesia Plan: General   Post-op Pain Management:    Induction:   PONV Risk Score and Plan: Propofol infusion  Airway Management Planned:   Additional Equipment:   Intra-op Plan:   Post-operative Plan:   Informed Consent: I have reviewed the patients History and Physical, chart, labs and discussed the procedure including the risks, benefits and alternatives for the proposed anesthesia with the patient or authorized representative who has indicated his/her understanding and acceptance.     Dental Advisory Given  Plan Discussed with: CRNA  Anesthesia Plan Comments:        Anesthesia Quick Evaluation

## 2022-07-27 NOTE — Anesthesia Postprocedure Evaluation (Signed)
Anesthesia Post Note  Patient: Christina Arroyo  Procedure(s) Performed: COLONOSCOPY WITH PROPOFOL  Patient location during evaluation: Phase II Anesthesia Type: General Level of consciousness: awake Pain management: pain level controlled Vital Signs Assessment: post-procedure vital signs reviewed and stable Respiratory status: spontaneous breathing and respiratory function stable Cardiovascular status: blood pressure returned to baseline and stable Postop Assessment: no headache and no apparent nausea or vomiting Anesthetic complications: no Comments: Late entry   No notable events documented.   Last Vitals:  Vitals:   07/27/22 0844 07/27/22 0845  BP:  (!) 105/59  Pulse:    Resp:  (!) 100  Temp: (!) 36.4 C   SpO2:  98%    Last Pain:  Vitals:   07/27/22 0845  TempSrc: Oral  PainSc: 0-No pain                 Louann Sjogren

## 2022-07-27 NOTE — H&P (Signed)
Primary Care Physician:  Celene Squibb, MD Primary Gastroenterologist:  Dr. Abbey Chatters  Pre-Procedure History & Physical: HPI:  Christina Arroyo is a 52 y.o. female is here for a colonoscopy to be performed for high risk colon cancer screening purposes, family history of colon cancer in father and uncle  Past Medical History:  Diagnosis Date   COVID-19    Gall stones    Hypertension    PONV (postoperative nausea and vomiting)    Pre-diabetes     Past Surgical History:  Procedure Laterality Date   CHOLECYSTECTOMY N/A 08/23/2013   Procedure: LAPAROSCOPIC CHOLECYSTECTOMY;  Surgeon: Jamesetta So, MD;  Location: [facility];  Service: General;  Laterality: N/A;   COLONOSCOPY N/A 10/28/2015   Procedure: COLONOSCOPY;  Surgeon: Danie Binder, MD;  Location: [facility];  Service: Endoscopy;  Laterality: N/A;  8:30 Am   TUBAL LIGATION  08/1997    Prior to Admission medications   Medication Sig Start Date End Date Taking? Authorizing Provider  Cholecalciferol 2000 units CAPS Take 1 capsule (2,000 Units total) daily by mouth. 04/02/17  Yes Dixon, Mary B, PA-C  hydrochlorothiazide (MICROZIDE) 12.5 MG capsule TAKE 1 CAPSULE BY MOUTH ONCE A DAY. 05/08/21  Yes Eulogio Bear, NP  levocetirizine (XYZAL) 5 MG tablet Take 5 mg by mouth every evening.   Yes [provider]  lisinopril (ZESTRIL) 20 MG tablet Take 1 tablet (20 mg total) by mouth daily. 03/06/21  Yes Eulogio Bear, NP  traZODone (DESYREL) 50 MG tablet Take 0.5-1 tablets (25-50 mg total) by mouth at bedtime as needed for sleep. Patient taking differently: Take 50 mg by mouth at bedtime as needed for sleep. 01/27/21  Yes Eulogio Bear, NP    Allergies as of 06/30/2022   (No Known Allergies)    Family History  Problem Relation Age of Onset   Hypertension Father    Cancer Father 64       colon? and prostate   Diabetes Father    Pulmonary embolism Mother    Cancer Paternal Grandfather     Social History    Socioeconomic History   Marital status: Married    Spouse name: Not on file   Number of children: Not on file   Years of education: Not on file   Highest education level: Not on file  Occupational History   Not on file  Tobacco Use   Smoking status: Never   Smokeless tobacco: Never  Vaping Use   Vaping Use: Never used  Substance and Sexual Activity   Alcohol use: No   Drug use: No   Sexual activity: Yes    Birth control/protection: Surgical    Comment: tubal  Other Topics Concern   Not on file  Social History Narrative   Entered 11/2014:    Married. 3 children--Ages 17,19,20 y/o.   ----57 y/o still at home--should graduate HS upcoming year.   --Other 2 are in colleg--1 at [employer]. 1 at [employer].       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     Social Determinants of Health   Financial Resource Strain: Low Risk  (03/15/2020)   Overall Financial Resource Strain (CARDIA)    Difficulty of Paying Living Expenses: Not hard at all  Food Insecurity: No Food Insecurity (03/15/2020)   Hunger Vital Sign    Worried About Running Out of Food in the Last Year: Never true  Ran Out of Food in the Last Year: Never true  Transportation Needs: No Transportation Needs (03/15/2020)   PRAPARE - Hydrologist (Medical): No    Lack of Transportation (Non-Medical): No  Physical Activity: Insufficiently Active (03/15/2020)   Exercise Vital Sign    Days of Exercise per Week: 1 day    Minutes of Exercise per Session: 20 min  Stress: No Stress Concern Present (03/15/2020)   [place]    Feeling of Stress : Only a little  Social Connections: Socially Integrated (03/15/2020)   Social Connection and Isolation Panel [NHANES]    Frequency of Communication with Friends and Family: More than three times a week    Frequency of Social Gatherings with Friends and  Family: Once a week    Attends Religious Services: More than 4 times per year    Active Member of [employer] or Organizations: Yes    Attends Archivist Meetings: More than 4 times per year    Marital Status: Married  Human resources officer Violence: Not At Risk (03/15/2020)   Humiliation, Afraid, Rape, and Kick questionnaire    Fear of Current or Ex-Partner: No    Emotionally Abused: No    Physically Abused: No    Sexually Abused: No    Review of Systems: See HPI, otherwise negative ROS  Physical Exam: Vital signs in last 24 hours: Temp:  [98.7 F (37.1 C)] 98.7 F (37.1 C) (03/11 0711) Pulse Rate:  [99] 99 (03/11 0711) Resp:  [20] 20 (03/11 0711) BP: (142)/(90) 142/90 (03/11 0711) SpO2:  [95 %] 95 % (03/11 0711)   General:   Alert,  Well-developed, well-nourished, pleasant and cooperative in NAD Head:  Normocephalic and atraumatic. Eyes:  Sclera clear, no icterus.   Conjunctiva pink. Ears:  Normal auditory acuity. Nose:  No deformity, discharge,  or lesions. Mouth:  No deformity or lesions, dentition normal. Neck:  Supple; no masses or thyromegaly. Lungs:  Clear throughout to auscultation.   No wheezes, crackles, or rhonchi. No acute distress. Heart:  Regular rate and rhythm; no murmurs, clicks, rubs,  or gallops. Abdomen:  Soft, nontender and nondistended. No masses, hepatosplenomegaly or hernias noted. Normal bowel sounds, without guarding, and without rebound.   Msk:  Symmetrical without gross deformities. Normal posture. Extremities:  Without clubbing or edema. Neurologic:  Alert and  oriented x4;  grossly normal neurologically. Skin:  Intact without significant lesions or rashes. Cervical Nodes:  No significant cervical adenopathy. Psych:  Alert and cooperative. Normal mood and affect.  Impression/Plan: Christina Arroyo is here for a colonoscopy to be performed for high risk colon cancer screening purposes, family history of colon cancer in father and uncle  The risks  of the procedure including infection, bleed, or perforation as well as benefits, limitations, alternatives and imponderables have been reviewed with the patient. Questions have been answered. All parties agreeable.

## 2022-07-27 NOTE — Op Note (Signed)
[facility] Patient Name: Christina Arroyo Procedure Date: 07/27/2022 8:13 AM MRN: [id number] Date of Birth: [birth year] Attending MD: Elon Alas. Edgar Frisk, [id number] CSN: [id number] Age: 52 Admit Type: Outpatient Procedure:                Colonoscopy Indications:              Screening in patient at increased risk: Colorectal                            cancer in father before age 52 and uncle Providers:                Elon Alas. Abbey Chatters, DO, Janeece Riggers, RN Referring MD:              Medicines:                See the Anesthesia note for documentation of the                            administered medications Complications:            No immediate complications. Estimated Blood Loss:     Estimated blood loss: none. Procedure:                Pre-Anesthesia Assessment:                           - The anesthesia plan was to use monitored                            anesthesia care (MAC).                           After obtaining informed consent, the colonoscope                            was passed under direct vision. Throughout the                            procedure, the patient's blood pressure, pulse, and                            oxygen saturations were monitored continuously. The                            [id number]-[id number]) scope was introduced through                            the anus and advanced to the the cecum, identified                            by appendiceal orifice and ileocecal valve. The                            colonoscopy was performed without difficulty. The  patient tolerated the procedure well. The quality                            of the bowel preparation was evaluated using the                            BBPS [facility] Bowel Preparation Scale) with scores                            of: Right Colon = 3, Transverse Colon = 3 and Left                            Colon = 3 (entire mucosa seen well with no residual                             staining, small fragments of stool or opaque                            liquid). The total BBPS score equals 9. Scope In: 8:25:54 AM Scope Out: 8:37:20 AM Scope Withdrawal Time: 0 hours 9 minutes 36 seconds  Total Procedure Duration: 0 hours 11 minutes 26 seconds  Findings:      Hemorrhoids were found on perianal exam.      A few medium-mouthed diverticula were found in the sigmoid colon.      Internal hemorrhoids were found during endoscopy. Impression:               - Hemorrhoids found on perianal exam.                           - Diverticulosis in the sigmoid colon.                           - Internal hemorrhoids.                           - No specimens collected. Moderate Sedation:      Per Anesthesia Care Recommendation:           - Patient has a contact number available for                            emergencies. The signs and symptoms of potential                            delayed complications were discussed with the                            patient. Return to normal activities tomorrow.                            Written discharge instructions were provided to the                            patient.                           -  Resume previous diet.                           - Continue present medications.                           - Repeat colonoscopy in 5 years for screening                            purposes.                           - Return to GI clinic PRN. Procedure Code(s):        --- Professional ---                           [id number], Colorectal cancer screening; colonoscopy on                            individual at high risk Diagnosis Code(s):        --- Professional ---                           Z80.0, Family history of malignant neoplasm of                            digestive organs                           K64.9, Unspecified hemorrhoids                           K57.30, Diverticulosis of large intestine without                             perforation or abscess without bleeding CPT copyright 2022 American Medical Association. All rights reserved. The codes documented in this report are preliminary and upon coder review may  be revised to meet current compliance requirements. Elon Alas. Abbey Chatters, DO [place] Abbey Chatters, DO 07/27/2022 8:40:20 AM This report has been signed electronically. Number of Addenda: 0

## 2022-07-27 NOTE — Discharge Instructions (Addendum)
  Colonoscopy Discharge Instructions  Read the instructions outlined below and refer to this sheet in the next few weeks. These discharge instructions provide you with general information on caring for yourself after you leave the hospital. Your doctor may also give you specific instructions. While your treatment has been planned according to the most current medical practices available, unavoidable complications occasionally occur.   ACTIVITY You may resume your regular activity, but move at a slower pace for the next 24 hours.  Take frequent rest periods for the next 24 hours.  Walking will help get rid of the air and reduce the bloated feeling in your belly (abdomen).  No driving for 24 hours (because of the medicine (anesthesia) used during the test).   Do not sign any important legal documents or operate any machinery for 24 hours (because of the anesthesia used during the test).  NUTRITION Drink plenty of fluids.  You may resume your normal diet as instructed by your doctor.  Begin with a light meal and progress to your normal diet. Heavy or fried foods are harder to digest and may make you feel sick to your stomach (nauseated).  Avoid alcoholic beverages for 24 hours or as instructed.  MEDICATIONS You may resume your normal medications unless your doctor tells you otherwise.  WHAT YOU CAN EXPECT TODAY Some feelings of bloating in the abdomen.  Passage of more gas than usual.  Spotting of blood in your stool or on the toilet paper.  IF YOU HAD POLYPS REMOVED DURING THE COLONOSCOPY: No aspirin products for 7 days or as instructed.  No alcohol for 7 days or as instructed.  Eat a soft diet for the next 24 hours.  FINDING OUT THE RESULTS OF YOUR TEST Not all test results are available during your visit. If your test results are not back during the visit, make an appointment with your caregiver to find out the results. Do not assume everything is normal if you have not heard from your  caregiver or the medical facility. It is important for you to follow up on all of your test results.  SEEK IMMEDIATE MEDICAL ATTENTION IF: You have more than a spotting of blood in your stool.  Your belly is swollen (abdominal distention).  You are nauseated or vomiting.  You have a temperature over 101.  You have abdominal pain or discomfort that is severe or gets worse throughout the day.   Your colonoscopy was relatively unremarkable.  I did not find any polyps or evidence of colon cancer.  I recommend repeating colonoscopy in 5 years for colon cancer screening purposes given your family history.  You do have diverticulosis and internal hemorrhoids. I would recommend increasing fiber in your diet or adding OTC Benefiber/Metamucil. Be sure to drink at least 4 to 6 glasses of water daily. Follow-up with GI as needed.   I hope you have a great rest of your week!  Elon Alas. Abbey Chatters, D.O. Gastroenterology and Hepatology [facility]

## 2022-07-27 NOTE — Transfer of Care (Signed)
Immediate Anesthesia Transfer of Care Note  Patient: Christina Arroyo  Procedure(s) Performed: COLONOSCOPY WITH PROPOFOL  Patient Location: Short Stay  Anesthesia Type:General  Level of Consciousness: sedated  Airway & Oxygen Therapy: Patient Spontanous Breathing  Post-op Assessment: Report given to RN and Post -op Vital signs reviewed and stable  Post vital signs: Reviewed and stable  Last Vitals:  Vitals Value Taken Time  BP 103/58   Temp 98   Pulse 88   Resp 16   SpO2 96     Last Pain:  Vitals:   07/27/22 0711  PainSc: 0-No pain      Patients Stated Pain Goal: 5 ([id number])  Complications: No notable events documented.

## 2022-08-05 ENCOUNTER — Encounter (HOSPITAL_COMMUNITY): Payer: Self-pay | Admitting: Internal Medicine

## 2022-11-17 ENCOUNTER — Other Ambulatory Visit: Payer: Self-pay

## 2022-11-17 ENCOUNTER — Encounter (HOSPITAL_COMMUNITY): Payer: Self-pay | Admitting: *Deleted

## 2022-11-17 ENCOUNTER — Emergency Department (HOSPITAL_COMMUNITY): Payer: BC Managed Care – PPO

## 2022-11-17 ENCOUNTER — Emergency Department (HOSPITAL_COMMUNITY)
Admission: EM | Admit: 2022-11-17 | Discharge: 2022-11-17 | Disposition: A | Discharge status: Home / Self Care | Payer: BC Managed Care – PPO | Attending: Emergency Medicine | Admitting: Emergency Medicine

## 2022-11-17 DIAGNOSIS — R531 Weakness: Secondary | ICD-10-CM | POA: Diagnosis not present

## 2022-11-17 DIAGNOSIS — Z79899 Other long term (current) drug therapy: Secondary | ICD-10-CM | POA: Insufficient documentation

## 2022-11-17 DIAGNOSIS — R42 Dizziness and giddiness: Secondary | ICD-10-CM | POA: Diagnosis not present

## 2022-11-17 DIAGNOSIS — I10 Essential (primary) hypertension: Secondary | ICD-10-CM | POA: Insufficient documentation

## 2022-11-17 DIAGNOSIS — R5381 Other malaise: Secondary | ICD-10-CM | POA: Diagnosis not present

## 2022-11-17 LAB — URINALYSIS, ROUTINE W REFLEX MICROSCOPIC
Bilirubin Urine: NEGATIVE
Glucose, UA: NEGATIVE mg/dL
Hgb urine dipstick: NEGATIVE
Ketones, ur: NEGATIVE mg/dL
Leukocytes,Ua: NEGATIVE
Nitrite: NEGATIVE
Protein, ur: NEGATIVE mg/dL
Specific Gravity, Urine: 1.013 (ref 1.005–1.030)
pH: 7 (ref 5.0–8.0)

## 2022-11-17 LAB — CBC
HCT: 41.9 % (ref 36.0–46.0)
Hemoglobin: 13.6 g/dL (ref 12.0–15.0)
MCH: 28.6 pg (ref 26.0–34.0)
MCHC: 32.5 g/dL (ref 30.0–36.0)
MCV: 88.2 fL (ref 80.0–100.0)
Platelets: 346 10*3/uL (ref 150–400)
RBC: 4.75 MIL/uL (ref 3.87–5.11)
RDW: 12.7 % (ref 11.5–15.5)
WBC: 5.4 10*3/uL (ref 4.0–10.5)
nRBC: 0 % (ref 0.0–0.2)

## 2022-11-17 LAB — BASIC METABOLIC PANEL
Anion gap: 9 (ref 5–15)
BUN: 11 mg/dL (ref 6–20)
CO2: 28 mmol/L (ref 22–32)
Calcium: 9.3 mg/dL (ref 8.9–10.3)
Chloride: 99 mmol/L (ref 98–111)
Creatinine, Ser: 0.72 mg/dL (ref 0.44–1.00)
GFR, Estimated: >60 mL/min (ref >60)
Glucose, Bld: 152 mg/dL — ABNORMAL HIGH (ref 70–99)
Potassium: 3.8 mmol/L (ref 3.5–5.1)
Sodium: 136 mmol/L (ref 135–145)

## 2022-11-17 LAB — D-DIMER, QUANTITATIVE: D-Dimer, Quant: <0.27 ug/mL-FEU (ref 0.00–0.50)

## 2022-11-17 MED ORDER — SODIUM CHLORIDE 0.9 % IV BOLUS
1000.0000 mL | Freq: Once | INTRAVENOUS | Status: AC
  Administered 2022-11-17: 1000 mL via INTRAVENOUS

## 2022-11-17 NOTE — Discharge Instructions (Addendum)
Continue to drink plenty of water.  Please follow-up with your primary care provider this week for recheck.  Return to the emergency department for any new or worsening symptoms.

## 2022-11-17 NOTE — ED Triage Notes (Signed)
Pt with c/o lightheadedness for past 2 days.

## 2022-11-17 NOTE — ED Provider Notes (Signed)
EMERGENCY DEPARTMENT AT [facility] Provider Note   CSN: [id number] Arrival date & time: 11/17/22  1541     History  Chief Complaint  Patient presents with   Dizziness    Christina Arroyo is a 52 y.o. female.   Dizziness Associated symptoms: nausea and weakness   Associated symptoms: no chest pain, no headaches, no shortness of breath and no vomiting        Christina Arroyo is a 52 y.o. female past medical history of hypertension, prediabetes who presents to the Emergency Department complaining of generalized malaise, lightheadedness.  Symptoms present x 2 days.  Symptoms have been intermittent.  Today, she states she was driving when she felt "lightheaded and weak." Describes a generalized weakness.  No chest pain or shortness of breath, facial weakness extremity weakness or headache.  No recent illness or medication changes. Denies recent tick bite  Home Medications Prior to Admission medications   Medication Sig Start Date End Date Taking? Authorizing Provider  Cholecalciferol 2000 units CAPS Take 1 capsule (2,000 Units total) daily by mouth. 04/02/17   Dixon, Patriciaann Clan, PA-C  hydrochlorothiazide (MICROZIDE) 12.5 MG capsule TAKE 1 CAPSULE BY MOUTH ONCE A DAY. 05/08/21   Valentino Nose, NP  levocetirizine (XYZAL) 5 MG tablet Take 5 mg by mouth every evening.    [provider]  lisinopril (ZESTRIL) 20 MG tablet Take 1 tablet (20 mg total) by mouth daily. 03/06/21   Valentino Nose, NP  traZODone (DESYREL) 50 MG tablet Take 0.5-1 tablets (25-50 mg total) by mouth at bedtime as needed for sleep. Patient taking differently: Take 50 mg by mouth at bedtime as needed for sleep. 01/27/21   Valentino Nose, NP      Allergies    Patient has no known allergies.    Review of Systems   Review of Systems  Constitutional:  Positive for fatigue. Negative for appetite change and chills.  Respiratory:  Negative for shortness of breath.   Cardiovascular:   Negative for chest pain.  Gastrointestinal:  Positive for nausea. Negative for abdominal pain and vomiting.  Genitourinary:  Negative for dysuria and flank pain.  Musculoskeletal:  Negative for neck pain and neck stiffness.  Neurological:  Positive for dizziness, weakness and light-headedness. Negative for syncope, numbness and headaches.    Physical Exam Updated Vital Signs BP 134/85   Pulse 70   Temp 98.5 F (36.9 C)   Resp 18   Ht 5' (1.524 m)   Wt 95.7 kg   SpO2 98%   BMI 41.21 kg/m  Physical Exam Vitals and nursing note reviewed.  Constitutional:      General: She is not in acute distress.    Appearance: Normal appearance. She is not toxic-appearing.  HENT:     Head: Atraumatic.     Mouth/Throat:     Mouth: Mucous membranes are moist.     Pharynx: Oropharynx is clear.  Eyes:     Extraocular Movements: Extraocular movements intact.     Conjunctiva/sclera: Conjunctivae normal.     Pupils: Pupils are equal, round, and reactive to light.  Neck:     Meningeal: Kernig's sign absent.  Cardiovascular:     Rate and Rhythm: Normal rate and regular rhythm.     Pulses: Normal pulses.  Pulmonary:     Effort: Pulmonary effort is normal.  Abdominal:     Palpations: Abdomen is soft.     Tenderness: There is no abdominal tenderness.  Musculoskeletal:  General: Normal range of motion.     Cervical back: Normal range of motion. No rigidity.     Right lower leg: No edema.     Left lower leg: No edema.  Skin:    General: Skin is warm.  Neurological:     General: No focal deficit present.     Mental Status: She is alert.     GCS: GCS eye subscore is 4. GCS verbal subscore is 5. GCS motor subscore is 6.     Sensory: Sensation is intact. No sensory deficit.     Motor: Motor function is intact. No weakness.     Coordination: Coordination is intact.     Comments: CN II through XII intact.  Speech clear.  No facial droop, no pronator drift     ED Results / Procedures /  Treatments   Labs (all labs ordered are listed, but only abnormal results are displayed) Labs Reviewed  BASIC METABOLIC PANEL - Abnormal; Notable for the following components:      Result Value   Glucose, Bld 152 (*)    All other components within normal limits  CBC  URINALYSIS, ROUTINE W REFLEX MICROSCOPIC  D-DIMER, QUANTITATIVE    EKG EKG Interpretation Date/Time:  Tuesday November 17 2022 15:55:45 EDT Ventricular Rate:  116 PR Interval:  148 QRS Duration:  78 QT Interval:  312 QTC Calculation: 433 R Axis:   66  Text Interpretation: Sinus tachycardia Nonspecific T wave abnormality Abnormal ECG When compared with ECG of 23-Jul-2022 09:36, No significant change was found Confirmed by Vonita Moss [id number]) on 11/17/2022 4:35:28 PM  Radiology DG Chest 2 View  Result Date: 11/17/2022 CLINICAL DATA:  Weakness EXAM: CHEST - 2 VIEW COMPARISON:  08/13/2020 FINDINGS: Lungs are clear.  No pleural effusion or pneumothorax. The heart is normal in size. Visualized osseous structures are within normal limits. Cholecystectomy clips. IMPRESSION: Normal chest radiographs. Electronically Signed   By: Charline Bills M.D.   On: 11/17/2022 19:54    Procedures Procedures    Medications Ordered in ED Medications  sodium chloride 0.9 % bolus 1,000 mL (1,000 mLs Intravenous New Bag/Given 11/17/22 2030)    ED Course/ Medical Decision Making/ A&P                             Medical Decision Making Patient here to come with complaint of lightheadedness, malaise generalized weakness x 2 days.  Symptoms have been intermittent.  Denies recent illness, new medications chest pain or shortness of breath.   Differential would include but not limited to acute viral versus bacterial process, electrolyte derangement, hyperglycemic state, TIA/CVA considered  Amount and/or Complexity of Data Reviewed Labs: ordered.    Details: Labs interpreted by me no evidence of leukocytosis, hemoglobin unremarkable,  chemistries without significant derangement, D-dimer unremarkable, urinalysis without evidence of infection Radiology: ordered.    Details: Chest x-ray without evidence of acute cardiopulmonary process ECG/medicine tests: ordered.    Details: EKG shows sinus tachycardia with nonspecific T wave abnormality when compared to EKG of March 2024 no significant change was found Discussion of management or test interpretation with external provider(s):   On recheck, patient resting comfortably.  Vital signs reassuring.  She is received IV fluids and reports feeling much better ready for discharge home.  Counseled on importance of adequate oral hydration.  She is agreeable to close outpatient follow-up with PCP.  Return precautions were discussed.  Final Clinical Impression(s) / ED Diagnoses Final diagnoses:  Lightheadedness    Rx / DC Orders ED Discharge Orders     None         Pauline Aus, PA-C 11/17/22 2243    Loetta Rough, MD 11/17/22 [phone]

## 2022-11-23 DIAGNOSIS — I10 Essential (primary) hypertension: Secondary | ICD-10-CM | POA: Diagnosis not present

## 2022-11-23 DIAGNOSIS — R110 Nausea: Secondary | ICD-10-CM | POA: Diagnosis not present

## 2022-11-23 DIAGNOSIS — E559 Vitamin D deficiency, unspecified: Secondary | ICD-10-CM | POA: Diagnosis not present

## 2022-11-23 DIAGNOSIS — R42 Dizziness and giddiness: Secondary | ICD-10-CM | POA: Diagnosis not present

## 2022-11-23 DIAGNOSIS — E119 Type 2 diabetes mellitus without complications: Secondary | ICD-10-CM | POA: Diagnosis not present

## 2022-12-01 DIAGNOSIS — E119 Type 2 diabetes mellitus without complications: Secondary | ICD-10-CM | POA: Diagnosis not present

## 2022-12-01 DIAGNOSIS — E559 Vitamin D deficiency, unspecified: Secondary | ICD-10-CM | POA: Diagnosis not present

## 2022-12-01 DIAGNOSIS — I10 Essential (primary) hypertension: Secondary | ICD-10-CM | POA: Diagnosis not present

## 2022-12-01 DIAGNOSIS — G4700 Insomnia, unspecified: Secondary | ICD-10-CM | POA: Diagnosis not present

## 2022-12-01 DIAGNOSIS — D259 Leiomyoma of uterus, unspecified: Secondary | ICD-10-CM | POA: Diagnosis not present

## 2022-12-01 DIAGNOSIS — E785 Hyperlipidemia, unspecified: Secondary | ICD-10-CM | POA: Diagnosis not present

## 2023-04-23 ENCOUNTER — Ambulatory Visit: Payer: BC Managed Care – PPO | Admitting: Obstetrics & Gynecology

## 2023-04-23 ENCOUNTER — Encounter: Payer: Self-pay | Admitting: Obstetrics & Gynecology

## 2023-04-23 ENCOUNTER — Other Ambulatory Visit (HOSPITAL_COMMUNITY)
Admission: RE | Admit: 2023-04-23 | Discharge: 2023-04-23 | Disposition: A | Discharge status: Home / Self Care | Payer: BC Managed Care – PPO | Source: Ambulatory Visit | Attending: Obstetrics & Gynecology | Admitting: Obstetrics & Gynecology

## 2023-04-23 VITALS — BP 123/80 | HR 108 | Ht 61.0 in | Wt 216.0 lb

## 2023-04-23 DIAGNOSIS — D219 Benign neoplasm of connective and other soft tissue, unspecified: Secondary | ICD-10-CM | POA: Diagnosis not present

## 2023-04-23 DIAGNOSIS — Z01419 Encounter for gynecological examination (general) (routine) without abnormal findings: Secondary | ICD-10-CM

## 2023-04-23 DIAGNOSIS — Z1151 Encounter for screening for human papillomavirus (HPV): Secondary | ICD-10-CM

## 2023-04-23 DIAGNOSIS — N3941 Urge incontinence: Secondary | ICD-10-CM | POA: Diagnosis not present

## 2023-04-23 DIAGNOSIS — Z30432 Encounter for removal of intrauterine contraceptive device: Secondary | ICD-10-CM

## 2023-04-23 MED ORDER — SOLIFENACIN SUCCINATE 10 MG PO TABS: 10.0000 mg | ORAL_TABLET | Freq: Every day | ORAL | 11 refills | Status: AC

## 2023-04-23 NOTE — Addendum Note (Signed)
Addended by: Moss Mc on: 04/23/2023 10:16 AM   Modules accepted: Orders

## 2023-04-23 NOTE — Progress Notes (Signed)
Subjective:     Christina Arroyo is a 52 y.o. female here for a routine exam.  No LMP recorded. (Menstrual status: IUD). [id number] Birth Control Method:  BTL Menstrual Calendar(currently): amenorrheic  Current complaints: wants fibroids checked.   Current acute medical issues:  none   Recent Gynecologic History No LMP recorded. (Menstrual status: IUD). Last Pap: 02/2020,  normal Last mammogram: 05/1022,  normal  Past Medical History:  Diagnosis Date   COVID-19    Gall stones    Hypertension    PONV (postoperative nausea and vomiting)    Pre-diabetes     Past Surgical History:  Procedure Laterality Date   CHOLECYSTECTOMY N/A 08/23/2013   Procedure: LAPAROSCOPIC CHOLECYSTECTOMY;  Surgeon: Dalia Heading, MD;  Location: [facility];  Service: General;  Laterality: N/A;   COLONOSCOPY N/A 10/28/2015   Procedure: COLONOSCOPY;  Surgeon: West Bali, MD;  Location: [facility];  Service: Endoscopy;  Laterality: N/A;  8:30 Am   COLONOSCOPY WITH PROPOFOL N/A 07/27/2022   Procedure: COLONOSCOPY WITH PROPOFOL;  Surgeon: Lanelle Bal, DO;  Location: [facility];  Service: Endoscopy;  Laterality: N/A;  8:00 am   TUBAL LIGATION  08/1997    OB History     Gravida  3   Para  3   Term  3   Preterm      AB      Living  3      SAB      IAB      Ectopic      Multiple      Live Births  3           Social History   Socioeconomic History   Marital status: Married    Spouse name: Not on file   Number of children: Not on file   Years of education: Not on file   Highest education level: Not on file  Occupational History   Not on file  Tobacco Use   Smoking status: Never   Smokeless tobacco: Never  Vaping Use   Vaping status: Never Used  Substance and Sexual Activity   Alcohol use: No   Drug use: No   Sexual activity: Yes    Birth control/protection: Surgical    Comment: tubal  Other Topics Concern   Not on file  Social History Narrative   Entered 11/2014:     Married. 3 children--Ages 17,19,20 y/o.   ----43 y/o still at home--should graduate HS upcoming year.   --Other 2 are in colleg--1 at [employer]. 1 at [employer]       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     Social Determinants of Health   Financial Resource Strain: Low Risk  (04/23/2023)   Overall Financial Resource Strain (CARDIA)    Difficulty of Paying Living Expenses: Not hard at all  Food Insecurity: No Food Insecurity (04/23/2023)   Hunger Vital Sign    Worried About Running Out of Food in the Last Year: Never true    Ran Out of Food in the Last Year: Never true  Transportation Needs: No Transportation Needs (04/23/2023)   PRAPARE - Administrator, Civil Service (Medical): No    Lack of Transportation (Non-Medical): No  Physical Activity: Insufficiently Active (04/23/2023)   Exercise Vital Sign    Days of Exercise per Week: 1 day    Minutes of Exercise per Session: 10 min  Stress:  Stress Concern Present (04/23/2023)   [employer] of Occupational Health - Occupational Stress Questionnaire    Feeling of Stress : Rather much  Social Connections: Socially Integrated (04/23/2023)   Social Connection and Isolation Panel [NHANES]    Frequency of Communication with Friends and Family: More than three times a week    Frequency of Social Gatherings with Friends and Family: Once a week    Attends Religious Services: More than 4 times per year    Active Member of [employer] or Organizations: Yes    Attends Engineer, structural: More than 4 times per year    Marital Status: Married    Family History  Problem Relation Age of Onset   Cancer Paternal Grandfather    Hypertension Father    Cancer Father 45       colon? and prostate   Diabetes Father    Lung cancer Father    Pulmonary embolism Mother      Current Outpatient Medications:    busPIRone (BUSPAR) 5 MG tablet, Take 5 mg by mouth 2 (two) times daily as needed.,  Disp: , Rfl:    Cholecalciferol 2000 units CAPS, Take 1 capsule (2,000 Units total) daily by mouth., Disp: 30 each, Rfl: 4   hydrochlorothiazide (MICROZIDE) 12.5 MG capsule, TAKE 1 CAPSULE BY MOUTH ONCE A DAY., Disp: 90 capsule, Rfl: 3   levocetirizine (XYZAL) 5 MG tablet, Take 5 mg by mouth every evening., Disp: , Rfl:    lisinopril (ZESTRIL) 20 MG tablet, Take 1 tablet (20 mg total) by mouth daily., Disp: 90 tablet, Rfl: 3   solifenacin (VESICARE) 10 MG tablet, Take 1 tablet (10 mg total) by mouth daily., Disp: 30 tablet, Rfl: 11   traZODone (DESYREL) 50 MG tablet, Take 0.5-1 tablets (25-50 mg total) by mouth at bedtime as needed for sleep. (Patient taking differently: Take 50 mg by mouth at bedtime as needed for sleep.), Disp: 30 tablet, Rfl: 0  Review of Systems  Review of Systems  Constitutional: Negative for fever, chills, weight loss, malaise/fatigue and diaphoresis.  HENT: Negative for hearing loss, ear pain, nosebleeds, congestion, sore throat, neck pain, tinnitus and ear discharge.   Eyes: Negative for blurred vision, double vision, photophobia, pain, discharge and redness.  Respiratory: Negative for cough, hemoptysis, sputum production, shortness of breath, wheezing and stridor.   Cardiovascular: Negative for chest pain, palpitations, orthopnea, claudication, leg swelling and PND.  Gastrointestinal: negative for abdominal pain. Negative for heartburn, nausea, vomiting, diarrhea, constipation, blood in stool and melena.  Genitourinary: Negative for dysuria, urgency, frequency, hematuria and flank pain.  Musculoskeletal: Negative for myalgias, back pain, joint pain and falls.  Skin: Negative for itching and rash.  Neurological: Negative for dizziness, tingling, tremors, sensory change, speech change, focal weakness, seizures, loss of consciousness, weakness and headaches.  Endo/Heme/Allergies: Negative for environmental allergies and polydipsia. Does not bruise/bleed easily.   Psychiatric/Behavioral: Negative for depression, suicidal ideas, hallucinations, memory loss and substance abuse. The patient is not nervous/anxious and does not have insomnia.        Objective:  Blood pressure 123/80, pulse (!) 108, height 5\' 1"  (1.549 m), weight 216 lb (98 kg).   Physical Exam  Vitals reviewed. Constitutional: She is oriented to person, place, and time. She appears well-developed and well-nourished.  HENT:  Head: Normocephalic and atraumatic.        Right Ear: External ear normal.  Left Ear: External ear normal.  Nose: Nose normal.  Mouth/Throat: Oropharynx is clear and moist.  Eyes:  Conjunctivae and EOM are normal. Pupils are equal, round, and reactive to light. Right eye exhibits no discharge. Left eye exhibits no discharge. No scleral icterus.  Neck: Normal range of motion. Neck supple. No tracheal deviation present. No thyromegaly present.  Cardiovascular: Normal rate, regular rhythm, normal heart sounds and intact distal pulses.  Exam reveals no gallop and no friction rub.   No murmur heard. Respiratory: Effort normal and breath sounds normal. No respiratory distress. She has no wheezes. She has no rales. She exhibits no tenderness.  GI: Soft. Bowel sounds are normal. She exhibits no distension and no mass. There is no tenderness. There is no rebound and no guarding.  Genitourinary:  Breasts no masses skin changes or nipple changes bilaterally      Vulva is normal without lesions Vagina is pink moist without discharge Cervix normal in appearance and pap is done Uterus is 16 weeks size irregular with lateral mobility noted Adnexa is negative with normal sized ovaries   Musculoskeletal: Normal range of motion. She exhibits no edema and no tenderness.  Neurological: She is alert and oriented to person, place, and time. She has normal reflexes. She displays normal reflexes. No cranial nerve deficit. She exhibits normal muscle tone. Coordination normal.  Skin: Skin  is warm and dry. No rash noted. No erythema. No pallor.  Psychiatric: She has a normal mood and affect. Her behavior is normal. Judgment and thought content normal.       Medications Ordered at today's visit: Meds ordered this encounter  Medications   solifenacin (VESICARE) 10 MG tablet    Sig: Take 1 tablet (10 mg total) by mouth daily.    Dispense:  30 tablet    Refill:  11    Other orders placed at today's visit: No orders of the defined types were placed in this encounter.    ASSESSMENT + PLAN:    ICD-10-CM   1. Well woman exam with routine gynecological exam  Z01.419     2. Fibroids: 370 cc approx volume 2017  D21.9    no scan since 2017, will scan and follow up    3. Urge incontinence  N39.41    Trial of vesicare 10 qhs    4. Encounter for IUD removal  Z30.432    removed today Mirena without difficulty          Return in about 6 weeks (around 06/04/2023) for GYN sono, Follow up, with Dr Despina Hidden.

## 2023-04-26 LAB — CYTOLOGY - PAP
Comment: NEGATIVE
Diagnosis: NEGATIVE
High risk HPV: NEGATIVE

## 2023-05-20 ENCOUNTER — Encounter (HOSPITAL_COMMUNITY): Payer: Self-pay | Admitting: Emergency Medicine

## 2023-05-20 ENCOUNTER — Other Ambulatory Visit: Payer: Self-pay

## 2023-05-20 ENCOUNTER — Emergency Department (HOSPITAL_COMMUNITY)
Admission: EM | Admit: 2023-05-20 | Discharge: 2023-05-20 | Disposition: A | Discharge status: Home / Self Care | Payer: BC Managed Care – PPO | Attending: Emergency Medicine | Admitting: Emergency Medicine

## 2023-05-20 DIAGNOSIS — R42 Dizziness and giddiness: Secondary | ICD-10-CM | POA: Insufficient documentation

## 2023-05-20 DIAGNOSIS — Z79899 Other long term (current) drug therapy: Secondary | ICD-10-CM | POA: Diagnosis not present

## 2023-05-20 DIAGNOSIS — I10 Essential (primary) hypertension: Secondary | ICD-10-CM | POA: Diagnosis not present

## 2023-05-20 MED ORDER — MECLIZINE HCL 12.5 MG PO TABS
25.0000 mg | ORAL_TABLET | Freq: Once | ORAL | Status: AC
  Administered 2023-05-20: 25 mg via ORAL
  Filled 2023-05-20: qty 2

## 2023-05-20 MED ORDER — ONDANSETRON 8 MG PO TBDP
8.0000 mg | ORAL_TABLET | Freq: Once | ORAL | Status: AC
  Administered 2023-05-20: 8 mg via ORAL
  Filled 2023-05-20: qty 1

## 2023-05-20 MED ORDER — MECLIZINE HCL 25 MG PO TABS: 25.0000 mg | ORAL_TABLET | Freq: Three times a day (TID) | ORAL | 0 refills | Status: AC | PRN

## 2023-05-20 NOTE — ED Triage Notes (Signed)
 Pt with c/o dizziness upon waking at 0400. Pt also c/o nausea.

## 2023-05-20 NOTE — ED Provider Notes (Signed)
 Christina Arroyo EMERGENCY DEPARTMENT AT [facility] Provider Note   CSN: [id number] Arrival date & time: 05/20/23  0550     History  Chief Complaint  Patient presents with   Dizziness    Christina Arroyo is a 53 y.o. female.  The history is provided by the patient.  Patient with history of hypertension presents with abrupt onset of dizziness that occurred around 4 AM.  She woke up at 4 AM and noticed that felt like things they were spinning.  She now feels lightheaded.  She felt well prior to going to bed.  No recent illnesses.  No fevers or vomiting.  No headache or visual changes.  No slurred speech or focal weakness.  She has been ambulatory.  No new medications.  No hearing changes.  She has never had this type of dizziness previously.  No previous history of CVA.     Home Medications Prior to Admission medications   Medication Sig Start Date End Date Taking? Authorizing Provider  meclizine  (ANTIVERT ) 25 MG tablet Take 1 tablet (25 mg total) by mouth 3 (three) times daily as needed for dizziness. 05/20/23  Yes Midge Golas, MD  busPIRone (BUSPAR) 5 MG tablet Take 5 mg by mouth 2 (two) times daily as needed. 02/25/23   [provider]  Cholecalciferol  2000 units CAPS Take 1 capsule (2,000 Units total) daily by mouth. 04/02/17   Dixon, Ronal NOVAK, PA-C  hydrochlorothiazide  (MICROZIDE ) 12.5 MG capsule TAKE 1 CAPSULE BY MOUTH ONCE A DAY. 05/08/21   Chandra Harlene LABOR, NP  levocetirizine (XYZAL) 5 MG tablet Take 5 mg by mouth every evening.    [provider]  lisinopril  (ZESTRIL ) 20 MG tablet Take 1 tablet (20 mg total) by mouth daily. 03/06/21   Chandra Harlene LABOR, NP  solifenacin  (VESICARE ) 10 MG tablet Take 1 tablet (10 mg total) by mouth daily. 04/23/23   Jayne Vonn DEL, MD  traZODone  (DESYREL ) 50 MG tablet Take 0.5-1 tablets (25-50 mg total) by mouth at bedtime as needed for sleep. Patient taking differently: Take 50 mg by mouth at bedtime as needed for sleep.  01/27/21   Chandra Harlene LABOR, NP      Allergies    Patient has no known allergies.    Review of Systems   Review of Systems  Constitutional:  Negative for fever.  HENT:  Negative for hearing loss and tinnitus.   Eyes:  Negative for visual disturbance.  Respiratory:  Negative for shortness of breath.   Cardiovascular:  Negative for chest pain.  Gastrointestinal:  Negative for diarrhea and vomiting.  Neurological:  Positive for dizziness. Negative for speech difficulty, weakness and headaches.    Physical Exam Updated Vital Signs BP (!) 125/93   Pulse 67   Temp 98.1 F (36.7 C)   Resp 18   Ht 1.549 m (5' 1)   Wt 96.6 kg   SpO2 99%   BMI 40.25 kg/m  Physical Exam CONSTITUTIONAL: Well developed/well nourished HEAD: Normocephalic/atraumatic EYES: EOMI/PERRL, no nystagmus, no visual field deficit  no ptosis ENMT: Mucous membranes moist Bilateral TMs are clear and intact NECK: supple no meningeal signs, no bruits CV: S1/S2 noted, no murmurs/rubs/gallops noted LUNGS: Lungs are clear to auscultation bilaterally, no apparent distress NEURO:Awake/alert, face symmetric, no arm or leg drift is noted Equal 5/5 strength with shoulder abduction, elbow flex/extension, wrist flex/extension in upper extremities and equal hand grips bilaterally Equal 5/5 strength with hip flexion,knee flex/extension, foot dorsi/plantar flexion Cranial nerves 3/4/5/6/11/23/08/11/12 tested and  intact No past pointing Sensation to light touch intact in all extremities EXTREMITIES: pulses normal, full ROM SKIN: warm, color normal PSYCH: no abnormalities of mood noted  ED Results / Procedures / Treatments   Labs (all labs ordered are listed, but only abnormal results are displayed) Labs Reviewed - No data to display  EKG EKG Interpretation Date/Time:  Thursday May 20 2023 06:30:15 EST Ventricular Rate:  62 PR Interval:  139 QRS Duration:  91 QT Interval:  370 QTC Calculation: 376 R  Axis:   71  Text Interpretation: Sinus rhythm Low voltage, precordial leads No significant change since last tracing Confirmed by Midge Golas ([id number]) on 05/20/2023 6:33:07 AM  Radiology No results found.  Procedures Procedures    Medications Ordered in ED Medications  ondansetron  (ZOFRAN -ODT) disintegrating tablet 8 mg (8 mg Oral Given 05/20/23 0615)  meclizine  (ANTIVERT ) tablet 25 mg (25 mg Oral Given 05/20/23 9373)    ED Course/ Medical Decision Making/ A&P Clinical Course as of 05/20/23 0655  Thu May 20, 2023  0631 Patient presents with abrupt onset of vertiginous-like symptoms after waking up, but now reports lightheadedness.  She is already feeling improved after getting Zofran .  She is awake and alert without any focal neurodeficits.  She walked into the ER. Strong suspicion this represents peripheral vertigo.  No recent vomiting or diarrhea low suspicion for dehydration or electrolyte abnormality EKG is unremarkable We will treat her symptoms and reassess [DW]  [phone] Patient already feeling much improved. She is awake and alert, talking on her phone in no acute distress.  No vomiting. Low suspicion for acute neurologic emergency.  Will provide Antivert  at discharge.  We discussed strict return precautions [DW]    Clinical Course User Index [DW] Midge Golas, MD                                 Medical Decision Making Amount and/or Complexity of Data Reviewed ECG/medicine tests: ordered.  Risk Prescription drug management.   This patient presents to the ED for concern of dizziness, this involves an extensive number of treatment options, and is a complaint that carries with it a high risk of complications and morbidity.  The differential diagnosis includes but is not limited to CVA, intracranial hemorrhage, acute coronary syndrome, renal failure, urinary tract infection, electrolyte disturbance, pneumonia   Comorbidities that complicate the patient  evaluation: Patient's presentation is complicated by their history of hypertension   Additional history obtained: Records reviewed Primary Care Documents  Cardiac Monitoring: The patient was maintained on a cardiac monitor.  I personally viewed and interpreted the cardiac monitor which showed an underlying rhythm of:  sinus rhythm  Medicines ordered and prescription drug management: I ordered medication including Zofran  for nausea Reevaluation of the patient after these medicines showed that the patient    improved  Test Considered: I considered neuroimaging, but since patient is already improving without any focal neurodeficits will defer at this time   Reevaluation: After the interventions noted above, I reevaluated the patient and found that they have :improved  Complexity of problems addressed: Patient's presentation is most consistent with  acute presentation with potential threat to life or bodily function  Disposition: After consideration of the diagnostic results and the patient's response to treatment,  I feel that the patent would benefit from discharge   .           Final Clinical Impression(s) / ED Diagnoses  Final diagnoses:  Dizziness    Rx / DC Orders ED Discharge Orders          Ordered    meclizine  (ANTIVERT ) 25 MG tablet  3 times daily PRN        05/20/23 9376              Midge Golas, MD 05/20/23 [phone]

## 2023-05-20 NOTE — Discharge Instructions (Signed)
Your exam shows you have had an episode of vertigo, which causes a false sense of movement such as a spinning feeling or walls that seem to move.  Most vertigo is caused by a (usually temporary) problem in the inner ear. Rarely, the back part of the brain can cause vertigo (some mini-strokes/strokes), but it appears to be a low risk cause for you at this time. It is important to follow-up with your doctor however, to see if you need further testing.   Do not drive or participate in potentially dangerous activities requiring balance unless off meds (not drowsy) and the vertigo has resolved. Most of the time benign vertigo is much better after a few days. However, mild unsteadiness may last for up to 3 months in some patients. An MRI scan or other special tests to evaluate your hearing and balance may be needed if the vertigo does not improve or returns in the future.   RETURN IMMEDIATELY IF YOU HAVE ANY OF THE FOLLOWING (call 911): Increasing vertigo, earache, ear drainage, or loss of hearing.  Severe headache, blurred or double vision, or trouble walking.  Fainting or poorly responsive, extreme weakness, chest pain, or palpitations.  Fever, persistent vomiting, or dehydration.  Numbness, tingling, incoordination, or weakness of the limbs.  Change in speech, vision, swallowing, understanding, or other concerns.  

## 2023-05-27 ENCOUNTER — Telehealth: Payer: Self-pay

## 2023-05-27 NOTE — Progress Notes (Signed)
 Transition Care Management Follow-up Telephone Call Date of discharge and from where: 05/20/2023 [facility] How have you been since you were released from the hospital? Patient stated she is feeling much better but is still experiencing lightheadedness. Any questions or concerns? No  Items Reviewed: Did the pt receive and understand the discharge instructions provided? Yes  Medications obtained and verified? Yes  Other? No  Any new allergies since your discharge? No  Dietary orders reviewed? Yes Do you have support at home? Yes   Follow up appointments reviewed:  PCP Hospital f/u appt confirmed?  Patient stated she will follow up as needed.  Scheduled to see  on  @ . Specialist Hospital f/u appt confirmed? No  Scheduled to see  on  @ . Are transportation arrangements needed? No  If their condition worsens, is the pt aware to call PCP or go to the Emergency Dept.? Yes Was the patient provided with contact information for the PCP's office or ED? Yes Was to pt encouraged to call back with questions or concerns? Yes   Herberta Pickron [facility]  [facility], [facility] Guide Direct Dial: [phone]  Website: [email or web address]

## 2023-06-03 ENCOUNTER — Other Ambulatory Visit: Payer: Self-pay | Admitting: Obstetrics & Gynecology

## 2023-06-03 DIAGNOSIS — D219 Benign neoplasm of connective and other soft tissue, unspecified: Secondary | ICD-10-CM

## 2023-06-07 ENCOUNTER — Ambulatory Visit: Payer: BC Managed Care – PPO | Admitting: Obstetrics & Gynecology

## 2023-06-07 ENCOUNTER — Encounter: Payer: Self-pay | Admitting: Obstetrics & Gynecology

## 2023-06-07 ENCOUNTER — Ambulatory Visit: Payer: BC Managed Care – PPO

## 2023-06-07 VITALS — BP 133/88 | HR 86 | Ht 60.0 in | Wt 219.0 lb

## 2023-06-07 DIAGNOSIS — N951 Menopausal and female climacteric states: Secondary | ICD-10-CM | POA: Diagnosis not present

## 2023-06-07 DIAGNOSIS — D219 Benign neoplasm of connective and other soft tissue, unspecified: Secondary | ICD-10-CM

## 2023-06-07 MED ORDER — PROGESTERONE 200 MG PO CAPS
200.0000 mg | ORAL_CAPSULE | Freq: Every day | ORAL | 1 refills | Status: DC
Start: 2023-06-07 — End: 2023-09-17

## 2023-06-07 NOTE — Progress Notes (Signed)
PELVIC US TA/TV: heterogeneous anteverted uterus with multiple fibroid,(#1) fundal subserosal fibroid 9 x 9 x 7 cm,(#2) anterior mid uterus intramural 4.9 x 4 x 2.6 cm,EEC 3.6 mm,normal ovaries (limited view),no free fluid,no pain during ultrasound  Chaperone Ferdinand Lango

## 2023-06-07 NOTE — Progress Notes (Signed)
Follow up appointment for results: Sonogram/fibroids  No chief complaint on file.   Blood pressure 133/88, pulse 86, height 5' (1.524 m), weight 219 lb (99.3 kg).  US PELVIC COMPLETE WITH TRANSVAGINAL Result Date: 06/07/2023 Images from the original result were not included.  ..an Financial trader of Ultrasound Medicine Technical sales engineer) accredited practice [facility] [address] [place] [address] Ordering Provider: Lazaro Arms, MD                                                                                                                                   GYNECOLOGIC SONOGRAM Christina Arroyo is a 53 y.o. [phone] No LMP recorded. Patient is perimenopausal. She is here for a pelvic sonogram for fibroids. Uterus                      12 x 8.8 x 9 cm, Total uterine volume 492 cc,enlarged heterogeneous anteverted uterus with multiple fibroid,(#1) fundal subserosal fibroid 9 x 9 x 7 cm,(#2) anterior mid uterus intramural 4.9 x 4 x 2.6 cm Endometrium          3.6 mm, symmetrical, WNL Right ovary             3.2 x 1.3 x 2.1 cm, WNL,limited view Left ovary                2.8 x 1.6 x 2.7 cm, WNL,limited view Technician Comments: PELVIC US TA/TV: enlarged heterogeneous anteverted uterus with multiple fibroid,(#1) fundal subserosal fibroid 9 x 9 x 7 cm,(#2) anterior mid uterus intramural 4.9 x 4 x 2.6 cm,EEC 3.6 mm,normal ovaries (limited view),no free fluid,no pain during ultrasound [employer] 06/07/2023 9:15 AM Clinical Impression and recommendations: I have reviewed the sonogram results above, combined with the patient's current clinical course, below are my impressions and any appropriate recommendations for management based on the sonographic findings. Uterus enlarged fibroid uterus 492 cc Endometrium thin normal for a perimenopausal woman Ovaries: relatively small consistent with perimenopausal state, normal morphology Lazaro Arms 06/07/2023 9:35 AM      Spotted 2 weeks after IUD, red/brown pink then resolved  MEDS ordered this encounter: Meds ordered this encounter  Medications   progesterone (PROMETRIUM) 200 MG capsule    Sig: Take 1 capsule (200 mg total) by mouth daily. The first 10 days of each calendar month    Dispense:  30 capsule    Refill:  1    Orders for this encounter: No orders of the defined types were placed in this encounter.   Impression + Management Plan   ICD-10-CM   1. Fibroids: 492 cc on sonogram 06/07/23  D21.9     2. Perimenopausal: cycle with progesterone every month x 3 the touch base  N95.1    Prometrium 200 mg daily first 10 days of each month      Follow  Up: Return for My Chart video visit after 09/05/23.     All questions were answered.  Past Medical History:  Diagnosis Date   COVID-19    Gall stones    Hypertension    PONV (postoperative nausea and vomiting)    Pre-diabetes     Past Surgical History:  Procedure Laterality Date   CHOLECYSTECTOMY N/A 08/23/2013   Procedure: LAPAROSCOPIC CHOLECYSTECTOMY;  Surgeon: Dalia Heading, MD;  Location: [facility];  Service: General;  Laterality: N/A;   COLONOSCOPY N/A 10/28/2015   Procedure: COLONOSCOPY;  Surgeon: West Bali, MD;  Location: [facility];  Service: Endoscopy;  Laterality: N/A;  8:30 Am   COLONOSCOPY WITH PROPOFOL N/A 07/27/2022   Procedure: COLONOSCOPY WITH PROPOFOL;  Surgeon: Lanelle Bal, DO;  Location: [facility];  Service: Endoscopy;  Laterality: N/A;  8:00 am   TUBAL LIGATION  08/1997    OB History     Gravida  3   Para  3   Term  3   Preterm      AB      Living  3      SAB      IAB      Ectopic      Multiple      Live Births  3           No Known Allergies  Social History   Socioeconomic History   Marital status: Married    Spouse name: Not on file   Number of children: Not on file   Years of education: Not on file   Highest education level: Not on file  Occupational History    Not on file  Tobacco Use   Smoking status: Never   Smokeless tobacco: Never  Vaping Use   Vaping status: Never Used  Substance and Sexual Activity   Alcohol use: No   Drug use: No   Sexual activity: Yes    Birth control/protection: Surgical    Comment: tubal  Other Topics Concern   Not on file  Social History Narrative   Entered 11/2014:    Married. 3 children--Ages 17,19,20 y/o.   ----74 y/o still at home--should graduate HS upcoming year.   --Other 2 are in colleg--1 at [employer]. 1 at [employer].       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     Social Drivers of Corporate investment banker Strain: Low Risk  (05/27/2023)   Overall Financial Resource Strain (CARDIA)    Difficulty of Paying Living Expenses: Not very hard  Food Insecurity: No Food Insecurity (05/27/2023)   Hunger Vital Sign    Worried About Running Out of Food in the Last Year: Never true    Ran Out of Food in the Last Year: Never true  Transportation Needs: No Transportation Needs (05/27/2023)   PRAPARE - Administrator, Civil Service (Medical): No    Lack of Transportation (Non-Medical): No  Physical Activity: Insufficiently Active (04/23/2023)   Exercise Vital Sign    Days of Exercise per Week: 1 day    Minutes of Exercise per Session: 10 min  Stress: Stress Concern Present (04/23/2023)   [employer] of Occupational Health - Occupational Stress Questionnaire    Feeling of Stress : Rather much  Social Connections: Socially Integrated (04/23/2023)   Social Connection and Isolation Panel [NHANES]    Frequency of Communication with Friends and Family: More  than three times a week    Frequency of Social Gatherings with Friends and Family: Once a week    Attends Religious Services: More than 4 times per year    Active Member of [employer] or Organizations: Yes    Attends Engineer, structural: More than 4 times per year    Marital Status: Married     Family History  Problem Relation Age of Onset   Cancer Paternal Grandfather    Hypertension Father    Cancer Father 89       colon? and prostate   Diabetes Father    Lung cancer Father    Pulmonary embolism Mother

## 2023-06-09 DIAGNOSIS — E559 Vitamin D deficiency, unspecified: Secondary | ICD-10-CM | POA: Diagnosis not present

## 2023-06-09 DIAGNOSIS — I10 Essential (primary) hypertension: Secondary | ICD-10-CM | POA: Diagnosis not present

## 2023-06-09 DIAGNOSIS — E119 Type 2 diabetes mellitus without complications: Secondary | ICD-10-CM | POA: Diagnosis not present

## 2023-06-15 ENCOUNTER — Other Ambulatory Visit (HOSPITAL_COMMUNITY): Payer: Self-pay | Admitting: Nurse Practitioner

## 2023-06-15 DIAGNOSIS — G4700 Insomnia, unspecified: Secondary | ICD-10-CM | POA: Diagnosis not present

## 2023-06-15 DIAGNOSIS — Z0000 Encounter for general adult medical examination without abnormal findings: Secondary | ICD-10-CM | POA: Diagnosis not present

## 2023-06-15 DIAGNOSIS — N959 Unspecified menopausal and perimenopausal disorder: Secondary | ICD-10-CM | POA: Diagnosis not present

## 2023-06-15 DIAGNOSIS — I10 Essential (primary) hypertension: Secondary | ICD-10-CM | POA: Diagnosis not present

## 2023-06-15 DIAGNOSIS — Z23 Encounter for immunization: Secondary | ICD-10-CM | POA: Diagnosis not present

## 2023-06-15 DIAGNOSIS — Z0001 Encounter for general adult medical examination with abnormal findings: Secondary | ICD-10-CM | POA: Diagnosis not present

## 2023-06-15 DIAGNOSIS — Z1231 Encounter for screening mammogram for malignant neoplasm of breast: Secondary | ICD-10-CM

## 2023-06-29 DIAGNOSIS — I10 Essential (primary) hypertension: Secondary | ICD-10-CM | POA: Diagnosis not present

## 2023-09-13 ENCOUNTER — Ambulatory Visit (HOSPITAL_COMMUNITY)
Admission: RE | Admit: 2023-09-13 | Discharge: 2023-09-13 | Disposition: A | Discharge status: Home / Self Care | Source: Ambulatory Visit | Attending: Nurse Practitioner | Admitting: Nurse Practitioner

## 2023-09-13 DIAGNOSIS — Z1231 Encounter for screening mammogram for malignant neoplasm of breast: Secondary | ICD-10-CM | POA: Insufficient documentation

## 2023-09-16 DIAGNOSIS — Z0000 Encounter for general adult medical examination without abnormal findings: Secondary | ICD-10-CM | POA: Diagnosis not present

## 2023-09-16 DIAGNOSIS — Z23 Encounter for immunization: Secondary | ICD-10-CM | POA: Diagnosis not present

## 2023-09-16 DIAGNOSIS — D529 Folate deficiency anemia, unspecified: Secondary | ICD-10-CM | POA: Diagnosis not present

## 2023-09-16 DIAGNOSIS — I10 Essential (primary) hypertension: Secondary | ICD-10-CM | POA: Diagnosis not present

## 2023-09-16 DIAGNOSIS — G4700 Insomnia, unspecified: Secondary | ICD-10-CM | POA: Diagnosis not present

## 2023-09-16 DIAGNOSIS — E559 Vitamin D deficiency, unspecified: Secondary | ICD-10-CM | POA: Diagnosis not present

## 2023-09-17 ENCOUNTER — Telehealth: Admitting: Obstetrics & Gynecology

## 2023-09-17 ENCOUNTER — Encounter: Payer: Self-pay | Admitting: Obstetrics & Gynecology

## 2023-09-17 DIAGNOSIS — D259 Leiomyoma of uterus, unspecified: Secondary | ICD-10-CM

## 2023-09-17 DIAGNOSIS — D219 Benign neoplasm of connective and other soft tissue, unspecified: Secondary | ICD-10-CM

## 2023-09-17 DIAGNOSIS — N3941 Urge incontinence: Secondary | ICD-10-CM

## 2023-09-17 DIAGNOSIS — N951 Menopausal and female climacteric states: Secondary | ICD-10-CM

## 2023-09-17 MED ORDER — PROGESTERONE 200 MG PO CAPS: 200.0000 mg | ORAL_CAPSULE | Freq: Every day | ORAL | 3 refills | Status: AC

## 2023-09-17 NOTE — Progress Notes (Signed)
 MyChart connect visit: video + audio I am in my office Pt is in her car(stationary) Total time 10 minutes    Follow up appointment  Response to cyclical prometrium   Chief Complaint  Patient presents with   Follow-up    There were no vitals taken for this visit.  Pt with enlarged fibroid uterus, close to 500 cc IUD removed 12/24 Preimenopausal state it appears On cyclical prometrium  200 every day x 10 days each month  Minimal spotting with each withdrawal, minimal cramping Pt is happy with the medication and her response to it Sleeping well and mood improved on it as well but wants to continue it cyclically  OAB: she states is not a big issue and she is currently not taking the vesicare   MEDS ordered this encounter: Meds ordered this encounter  Medications   progesterone  (PROMETRIUM ) 200 MG capsule    Sig: Take 1 capsule (200 mg total) by mouth daily. The first 10 days of each calendar month    Dispense:  30 capsule    Refill:  3    Orders for this encounter: No orders of the defined types were placed in this encounter.   Impression + Management Plan   ICD-10-CM   1. Fibroids: 492 cc on sonogram 06/07/23  D21.9    Continue cyclical prometrium  200 qhs x 10d    2. Perimenopausal: managed with prometrium   N95.1     3. Urge incontinence: currently not taking vesicare   N39.41       Follow Up: No follow-ups on file.     All questions were answered.  Past Medical History:  Diagnosis Date   COVID-19    Gall stones    Hypertension    PONV (postoperative nausea and vomiting)    Pre-diabetes     Past Surgical History:  Procedure Laterality Date   CHOLECYSTECTOMY N/A 08/23/2013   Procedure: LAPAROSCOPIC CHOLECYSTECTOMY;  Surgeon: Beau Bound, MD;  Location: [facility];  Service: General;  Laterality: N/A;   COLONOSCOPY N/A 10/28/2015   Procedure: COLONOSCOPY;  Surgeon: Alyce Jubilee, MD;  Location: [facility];  Service: Endoscopy;  Laterality: N/A;  8:30 Am    COLONOSCOPY WITH PROPOFOL  N/A 07/27/2022   Procedure: COLONOSCOPY WITH PROPOFOL ;  Surgeon: Vinetta Greening, DO;  Location: [facility];  Service: Endoscopy;  Laterality: N/A;  8:00 am   TUBAL LIGATION  08/1997    OB History     Gravida  3   Para  3   Term  3   Preterm      AB      Living  3      SAB      IAB      Ectopic      Multiple      Live Births  3           No Known Allergies  Social History   Socioeconomic History   Marital status: Married    Spouse name: Not on file   Number of children: Not on file   Years of education: Not on file   Highest education level: Not on file  Occupational History   Not on file  Tobacco Use   Smoking status: Never   Smokeless tobacco: Never  Vaping Use   Vaping status: Never Used  Substance and Sexual Activity   Alcohol use: No   Drug use: No   Sexual activity: Yes    Birth control/protection: Surgical    Comment:  tubal  Other Topics Concern   Not on file  Social History Narrative   Entered 11/2014:    Married. 3 children--Ages 17,19,20 y/o.   ----53 y/o still at home--should graduate HS upcoming year.   --Other 2 are in colleg--1 at [employer]. 1 at [employer].       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     Social Drivers of Corporate investment banker Strain: Low Risk  (05/27/2023)   Overall Financial Resource Strain (CARDIA)    Difficulty of Paying Living Expenses: Not very hard  Food Insecurity: No Food Insecurity (05/27/2023)   Hunger Vital Sign    Worried About Running Out of Food in the Last Year: Never true    Ran Out of Food in the Last Year: Never true  Transportation Needs: No Transportation Needs (05/27/2023)   PRAPARE - Administrator, Civil Service (Medical): No    Lack of Transportation (Non-Medical): No  Physical Activity: Insufficiently Active (04/23/2023)   Exercise Vital Sign    Days of Exercise per Week: 1 day    Minutes of  Exercise per Session: 10 min  Stress: Stress Concern Present (04/23/2023)   [employer] of Occupational Health - Occupational Stress Questionnaire    Feeling of Stress : Rather much  Social Connections: Socially Integrated (04/23/2023)   Social Connection and Isolation Panel [NHANES]    Frequency of Communication with Friends and Family: More than three times a week    Frequency of Social Gatherings with Friends and Family: Once a week    Attends Religious Services: More than 4 times per year    Active Member of [employer] or Organizations: Yes    Attends Engineer, structural: More than 4 times per year    Marital Status: Married    Family History  Problem Relation Age of Onset   Cancer Paternal Grandfather    Hypertension Father    Cancer Father 91       colon? and prostate   Diabetes Father    Lung cancer Father    Pulmonary embolism Mother

## 2023-12-24 DIAGNOSIS — E119 Type 2 diabetes mellitus without complications: Secondary | ICD-10-CM | POA: Diagnosis not present

## 2023-12-24 DIAGNOSIS — I10 Essential (primary) hypertension: Secondary | ICD-10-CM | POA: Diagnosis not present

## 2023-12-30 DIAGNOSIS — I10 Essential (primary) hypertension: Secondary | ICD-10-CM | POA: Diagnosis not present

## 2023-12-30 DIAGNOSIS — D259 Leiomyoma of uterus, unspecified: Secondary | ICD-10-CM | POA: Diagnosis not present

## 2023-12-30 DIAGNOSIS — G4700 Insomnia, unspecified: Secondary | ICD-10-CM | POA: Diagnosis not present

## 2023-12-30 DIAGNOSIS — E559 Vitamin D deficiency, unspecified: Secondary | ICD-10-CM | POA: Diagnosis not present

## 2024-02-10 DIAGNOSIS — Z6841 Body Mass Index (BMI) 40.0 and over, adult: Secondary | ICD-10-CM | POA: Diagnosis not present

## 2024-04-11 DIAGNOSIS — Z20822 Contact with and (suspected) exposure to covid-19: Secondary | ICD-10-CM | POA: Diagnosis not present

## 2024-04-11 DIAGNOSIS — I10 Essential (primary) hypertension: Secondary | ICD-10-CM | POA: Diagnosis not present

## 2024-04-11 DIAGNOSIS — E119 Type 2 diabetes mellitus without complications: Secondary | ICD-10-CM | POA: Diagnosis not present

## 2024-04-11 DIAGNOSIS — E785 Hyperlipidemia, unspecified: Secondary | ICD-10-CM | POA: Diagnosis not present

## 2024-04-11 DIAGNOSIS — R051 Acute cough: Secondary | ICD-10-CM | POA: Diagnosis not present
